# Patient Record
Sex: Male | Born: 1995 | Race: Black or African American | Hispanic: No | Marital: Single | Smoking: Current every day smoker
Health system: Southern US, Community
[De-identification: ages and names within clinical notes are randomized; demographics above are authoritative.]

## PROBLEM LIST (undated history)

## (undated) ENCOUNTER — Emergency Department (HOSPITAL_COMMUNITY): Admission: EM | Discharge status: Absent without leave | Payer: Self-pay

## (undated) DIAGNOSIS — Z789 Other specified health status: Secondary | ICD-10-CM

## (undated) HISTORY — PX: NO PAST SURGERIES: SHX2092

---
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
## 2013-10-29 ENCOUNTER — Emergency Department (HOSPITAL_COMMUNITY)
Admission: EM | Admit: 2013-10-29 | Discharge: 2013-10-29 | Disposition: A | Discharge status: Home / Self Care | Payer: Self-pay | Attending: Emergency Medicine | Admitting: Emergency Medicine

## 2013-10-29 ENCOUNTER — Emergency Department (HOSPITAL_COMMUNITY): Payer: Self-pay

## 2013-10-29 ENCOUNTER — Encounter (HOSPITAL_COMMUNITY): Payer: Self-pay | Admitting: Emergency Medicine

## 2013-10-29 DIAGNOSIS — J36 Peritonsillar abscess: Secondary | ICD-10-CM | POA: Insufficient documentation

## 2013-10-29 LAB — I-STAT CHEM 8, ED
BUN: 10 mg/dL (ref 6–23)
CALCIUM ION: 1.19 mmol/L (ref 1.12–1.23)
Chloride: 100 mEq/L (ref 96–112)
Creatinine, Ser: 0.9 mg/dL (ref 0.50–1.35)
GLUCOSE: 109 mg/dL — AB (ref 70–99)
HEMATOCRIT: 47 % (ref 39.0–52.0)
HEMOGLOBIN: 16 g/dL (ref 13.0–17.0)
Potassium: 3.8 mEq/L (ref 3.7–5.3)
Sodium: 139 mEq/L (ref 137–147)
TCO2: 23 mmol/L (ref 0–100)

## 2013-10-29 LAB — CBC WITH DIFFERENTIAL/PLATELET
BASOS ABS: 0 10*3/uL (ref 0.0–0.1)
BASOS PCT: 0 % (ref 0–1)
Eosinophils Absolute: 0.1 10*3/uL (ref 0.0–0.7)
Eosinophils Relative: 0 % (ref 0–5)
HCT: 41.5 % (ref 39.0–52.0)
Hemoglobin: 14.2 g/dL (ref 13.0–17.0)
Lymphocytes Relative: 7 % — ABNORMAL LOW (ref 12–46)
Lymphs Abs: 1.4 10*3/uL (ref 0.7–4.0)
MCH: 29.6 pg (ref 26.0–34.0)
MCHC: 34.2 g/dL (ref 30.0–36.0)
MCV: 86.5 fL (ref 78.0–100.0)
MONO ABS: 2.5 10*3/uL — AB (ref 0.1–1.0)
Monocytes Relative: 13 % — ABNORMAL HIGH (ref 3–12)
NEUTROS ABS: 15.6 10*3/uL — AB (ref 1.7–7.7)
NEUTROS PCT: 80 % — AB (ref 43–77)
Platelets: 272 10*3/uL (ref 150–400)
RBC: 4.8 MIL/uL (ref 4.22–5.81)
RDW: 12.4 % (ref 11.5–15.5)
WBC: 19.6 10*3/uL — ABNORMAL HIGH (ref 4.0–10.5)

## 2013-10-29 LAB — COMPREHENSIVE METABOLIC PANEL
ALBUMIN: 4.2 g/dL (ref 3.5–5.2)
ALT: 8 U/L (ref 0–53)
AST: 15 U/L (ref 0–37)
Alkaline Phosphatase: 102 U/L (ref 39–117)
BILIRUBIN TOTAL: 1 mg/dL (ref 0.3–1.2)
BUN: 11 mg/dL (ref 6–23)
CHLORIDE: 97 meq/L (ref 96–112)
CO2: 23 mEq/L (ref 19–32)
CREATININE: 0.9 mg/dL (ref 0.50–1.35)
Calcium: 9.8 mg/dL (ref 8.4–10.5)
GFR calc Af Amer: >90 mL/min (ref >90)
GFR calc non Af Amer: >90 mL/min (ref >90)
Glucose, Bld: 93 mg/dL (ref 70–99)
POTASSIUM: 4 meq/L (ref 3.7–5.3)
Sodium: 137 mEq/L (ref 137–147)
Total Protein: 7.8 g/dL (ref 6.0–8.3)

## 2013-10-29 LAB — RAPID STREP SCREEN (MED CTR MEBANE ONLY): STREPTOCOCCUS, GROUP A SCREEN (DIRECT): NEGATIVE

## 2013-10-29 MED ORDER — DEXAMETHASONE SODIUM PHOSPHATE 10 MG/ML IJ SOLN
10.0000 mg | Freq: Once | INTRAMUSCULAR | Status: AC
  Administered 2013-10-29: 10 mg via INTRAVENOUS
  Filled 2013-10-29: qty 1

## 2013-10-29 MED ORDER — SODIUM CHLORIDE 0.9 % IV BOLUS (SEPSIS)
1000.0000 mL | Freq: Once | INTRAVENOUS | Status: AC
  Administered 2013-10-29: 1000 mL via INTRAVENOUS

## 2013-10-29 MED ORDER — IOHEXOL 300 MG/ML  SOLN
100.0000 mL | Freq: Once | INTRAMUSCULAR | Status: AC | PRN
  Administered 2013-10-29: 100 mL via INTRAVENOUS

## 2013-10-29 MED ORDER — CLINDAMYCIN PHOSPHATE 600 MG/50ML IV SOLN
600.0000 mg | Freq: Once | INTRAVENOUS | Status: AC
  Administered 2013-10-29: 600 mg via INTRAVENOUS
  Filled 2013-10-29: qty 50

## 2013-10-29 MED ORDER — CLINDAMYCIN HCL 150 MG PO CAPS: 450.0000 mg | ORAL_CAPSULE | Freq: Three times a day (TID) | ORAL | Status: DC

## 2013-10-29 NOTE — ED Notes (Signed)
Pt c/o sore throat x 1 week, no cough or redness noted to back of throat.

## 2013-10-29 NOTE — Progress Notes (Signed)
[facility] provided pt with a list of primary care resources to help patient establish a pcp.  °

## 2013-10-29 NOTE — ED Provider Notes (Signed)
CSN: [id number]     Arrival date & time 10/29/13  1135 History   None    Chief Complaint  Patient presents with  . Sore Throat   Patient is a 18 y.o. male presenting with pharyngitis. The history is provided by the patient. No language interpreter was used.  Sore Throat Pertinent negatives include no chest pain and no shortness of breath.   This chart was scribed for non-physician practitioner working with Rolan Bucco, MD, by Andrew Au, ED Scribe. This patient was seen in room WTR7/WTR7 and the patient's care was started at 1:41 PM.  Christopher Shepard is a 18 y.o. male who presents to the Emergency Department complaining of a sore throat x 1 week. Pt reports his throat is swollen with associated chills, neck pain when turing to head to left, burning pain with swallowing, and a productive cough consisting of mucous. Pt reports he has pain with talking.  Pt is a smoker and smokes about 1-2 pack daily. Pt denies fever, ear pain, CP SOB cough, nasal congestion nausea and emesis, numbness, tingling.  Pt does not have PCP.  History reviewed. No pertinent past medical history. History reviewed. No pertinent past surgical history. No family history on file. History  Substance Use Topics  . Smoking status: Never Smoker   . Smokeless tobacco: Not on file  . Alcohol Use: No    Review of Systems  Constitutional: Positive for chills. Negative for fever.  HENT: Positive for sore throat, trouble swallowing and voice change. Negative for congestion and ear pain.   Respiratory: Positive for cough. Negative for chest tightness and shortness of breath.   Cardiovascular: Negative for chest pain.  Gastrointestinal: Negative for nausea, vomiting and diarrhea.  Musculoskeletal: Positive for neck pain.   Allergies  Review of patient's allergies indicates no known allergies.  Home Medications   Prior to Admission medications   Medication Sig Start Date End Date Taking? Authorizing Provider  ibuprofen  (ADVIL,MOTRIN) 200 MG tablet Take 200 mg by mouth every 6 (six) hours as needed for moderate pain.   Yes Historical Provider, MD  clindamycin (CLEOCIN) 150 MG capsule Take 3 capsules (450 mg total) by mouth 3 (three) times daily. 10/29/13   Marissa Sciacca, PA-C   BP 138/68  Pulse 56  Temp(Src) 98.9 F (37.2 C) (Oral)  Resp 18  SpO2 99% Physical Exam  Nursing note and vitals reviewed. Constitutional: He is oriented to person, place, and time. He appears well-developed and well-nourished. No distress.  HENT:  Head: Normocephalic and atraumatic.  Mouth/Throat: Oropharynx is clear and moist. No oropharyngeal exudate.  Bilateral tonsillar adenopathy identified with left more so than right. Uvula midline, swelling identified to the uvula. Mild erythema identified to the posterior oropharynx and tonsils bilaterally. Negative exudate. Negative petechiae. Mild trismus.  Eyes: Conjunctivae and EOM are normal. Pupils are equal, round, and reactive to light. Right eye exhibits no discharge. Left eye exhibits no discharge.  Neck: Normal range of motion. Neck supple. No tracheal deviation present.    Moderate left side tonsillar adenopathy identified with mild discomfort upon palpation. Discomfort upon palpation to the left aspect of the neck. Mild swelling identified to left aspect of the neck.  Cardiovascular: Normal rate, regular rhythm and normal heart sounds.  Exam reveals no friction rub.   No murmur heard. Pulses:      Radial pulses are 2+ on the right side, and 2+ on the left side.  Cap refill < 3 seconds  Pulmonary/Chest: Effort normal  and breath sounds normal. No respiratory distress. He has no wheezes. He has no rales.  Patient is able to speak in full sentences without difficulty Negative use of accessory muscles Negative stridor Patient speaks in a muffled voice  Musculoskeletal: Normal range of motion.  Full ROM to upper and lower extremities without difficulty noted, negative ataxia  noted.  Lymphadenopathy:    He has cervical adenopathy.  Neurological: He is alert and oriented to person, place, and time. No cranial nerve deficit. He exhibits normal muscle tone. Coordination normal.  Cranial nerves III-XII grossly intact Negative facial drooping Negative slurred speech Negative aphasia  Skin: Skin is warm and dry. No rash noted. He is not diaphoretic. No erythema.  Psychiatric: He has a normal mood and affect. His behavior is normal. Thought content normal.    ED Course  Procedures  5:58 PM This provider spoke with Dr. Jenne PaneBates, ENT - discussed case, history, labs and imaging in great detail. As per physician recommended IV clindamycin and Decadron to be administered in ED setting. Reported that patient can be discharged the patient will followup in his office tomorrow for the abscess to be drained.  Results for orders placed during the hospital encounter of 10/29/13  RAPID STREP SCREEN      Result Value Ref Range   Streptococcus, Group A Screen (Direct) NEGATIVE  NEGATIVE  CBC WITH DIFFERENTIAL      Result Value Ref Range   WBC 19.6 (*) 4.0 - 10.5 K/uL   RBC 4.80  4.22 - 5.81 MIL/uL   Hemoglobin 14.2  13.0 - 17.0 g/dL   HCT 16.141.5  09.639.0 - 04.552.0 %   MCV 86.5  78.0 - 100.0 fL   MCH 29.6  26.0 - 34.0 pg   MCHC 34.2  30.0 - 36.0 g/dL   RDW 40.912.4  81.111.5 - 91.415.5 %   Platelets 272  150 - 400 K/uL   Neutrophils Relative % 80 (*) 43 - 77 %   Neutro Abs 15.6 (*) 1.7 - 7.7 K/uL   Lymphocytes Relative 7 (*) 12 - 46 %   Lymphs Abs 1.4  0.7 - 4.0 K/uL   Monocytes Relative 13 (*) 3 - 12 %   Monocytes Absolute 2.5 (*) 0.1 - 1.0 K/uL   Eosinophils Relative 0  0 - 5 %   Eosinophils Absolute 0.1  0.0 - 0.7 K/uL   Basophils Relative 0  0 - 1 %   Basophils Absolute 0.0  0.0 - 0.1 K/uL  COMPREHENSIVE METABOLIC PANEL      Result Value Ref Range   Sodium 137  137 - 147 mEq/L   Potassium 4.0  3.7 - 5.3 mEq/L   Chloride 97  96 - 112 mEq/L   CO2 23  19 - 32 mEq/L   Glucose, Bld 93   70 - 99 mg/dL   BUN 11  6 - 23 mg/dL   Creatinine, Ser 7.820.90  0.50 - 1.35 mg/dL   Calcium 9.8  8.4 - 95.610.5 mg/dL   Total Protein 7.8  6.0 - 8.3 g/dL   Albumin 4.2  3.5 - 5.2 g/dL   AST 15  0 - 37 U/L   ALT 8  0 - 53 U/L   Alkaline Phosphatase 102  39 - 117 U/L   Total Bilirubin 1.0  0.3 - 1.2 mg/dL   GFR calc non Af Amer >90  >90 mL/min   GFR calc Af Amer >90  >90 mL/min  I-STAT CHEM 8, ED  Result Value Ref Range   Sodium 139  137 - 147 mEq/L   Potassium 3.8  3.7 - 5.3 mEq/L   Chloride 100  96 - 112 mEq/L   BUN 10  6 - 23 mg/dL   Creatinine, Ser 1.610.90  0.50 - 1.35 mg/dL   Glucose, Bld [id number] (*) 70 - 99 mg/dL   Calcium, Ion 0.451.19  4.091.12 - 1.23 mmol/L   TCO2 23  0 - 100 mmol/L   Hemoglobin 16.0  13.0 - 17.0 g/dL   HCT 81.147.0  91.439.0 - 78.252.0 %    Labs Review Labs Reviewed  CBC WITH DIFFERENTIAL - Abnormal; Notable for the following:    WBC 19.6 (*)    Neutrophils Relative % 80 (*)    Neutro Abs 15.6 (*)    Lymphocytes Relative 7 (*)    Monocytes Relative 13 (*)    Monocytes Absolute 2.5 (*)    All other components within normal limits  I-STAT CHEM 8, ED - Abnormal; Notable for the following:    Glucose, Bld 109 (*)    All other components within normal limits  RAPID STREP SCREEN  CULTURE, GROUP A STREP  COMPREHENSIVE METABOLIC PANEL   Results for orders placed during the hospital encounter of 10/29/13  RAPID STREP SCREEN      Result Value Ref Range   Streptococcus, Group A Screen (Direct) NEGATIVE  NEGATIVE  CBC WITH DIFFERENTIAL      Result Value Ref Range   WBC 19.6 (*) 4.0 - 10.5 K/uL   RBC 4.80  4.22 - 5.81 MIL/uL   Hemoglobin 14.2  13.0 - 17.0 g/dL   HCT 95.641.5  21.339.0 - 08.652.0 %   MCV 86.5  78.0 - 100.0 fL   MCH 29.6  26.0 - 34.0 pg   MCHC 34.2  30.0 - 36.0 g/dL   RDW 57.812.4  46.911.5 - 62.915.5 %   Platelets 272  150 - 400 K/uL   Neutrophils Relative % 80 (*) 43 - 77 %   Neutro Abs 15.6 (*) 1.7 - 7.7 K/uL   Lymphocytes Relative 7 (*) 12 - 46 %   Lymphs Abs 1.4  0.7 - 4.0 K/uL    Monocytes Relative 13 (*) 3 - 12 %   Monocytes Absolute 2.5 (*) 0.1 - 1.0 K/uL   Eosinophils Relative 0  0 - 5 %   Eosinophils Absolute 0.1  0.0 - 0.7 K/uL   Basophils Relative 0  0 - 1 %   Basophils Absolute 0.0  0.0 - 0.1 K/uL  COMPREHENSIVE METABOLIC PANEL      Result Value Ref Range   Sodium 137  137 - 147 mEq/L   Potassium 4.0  3.7 - 5.3 mEq/L   Chloride 97  96 - 112 mEq/L   CO2 23  19 - 32 mEq/L   Glucose, Bld 93  70 - 99 mg/dL   BUN 11  6 - 23 mg/dL   Creatinine, Ser 5.280.90  0.50 - 1.35 mg/dL   Calcium 9.8  8.4 - 41.310.5 mg/dL   Total Protein 7.8  6.0 - 8.3 g/dL   Albumin 4.2  3.5 - 5.2 g/dL   AST 15  0 - 37 U/L   ALT 8  0 - 53 U/L   Alkaline Phosphatase 102  39 - 117 U/L   Total Bilirubin 1.0  0.3 - 1.2 mg/dL   GFR calc non Af Amer >90  >90 mL/min   GFR calc Af Amer >90  >90  mL/min  I-STAT CHEM 8, ED      Result Value Ref Range   Sodium 139  137 - 147 mEq/L   Potassium 3.8  3.7 - 5.3 mEq/L   Chloride 100  96 - 112 mEq/L   BUN 10  6 - 23 mg/dL   Creatinine, Ser 1.61  0.50 - 1.35 mg/dL   Glucose, Bld 096 (*) 70 - 99 mg/dL   Calcium, Ion 0.45  4.09 - 1.23 mmol/L   TCO2 23  0 - 100 mmol/L   Hemoglobin 16.0  13.0 - 17.0 g/dL   HCT 81.1  91.4 - 78.2 %   Imaging Review Ct Soft Tissue Neck W Contrast  10/29/2013   CLINICAL DATA:  Uvula swelling and deviation.  EXAM: CT NECK WITH CONTRAST  TECHNIQUE: Multidetector CT imaging of the neck was performed using the standard protocol following the bolus administration of intravenous contrast.  CONTRAST:  OMNIPAQUE IOHEXOL 300 MG/ML  SOLN  COMPARISON:  None.  FINDINGS: The visualized portions of the brain and posterior fossa demonstrate a normal appearance.  The visualized paranasal sinuses are clear.  No mastoid effusion.  The salivary glands including the parotid glands and submandibular glands are within normal limits.  Oral cavity is normal without mass lesion or loculated fluid collection.  A 1.0 x 1.5 x 1.2 cm hypodense rim  enhancing collection seen inferior and slightly posterior to the left palatine tonsil (series 2, image 46), compatible with peritonsillar abscess. There is mild anterior and medial displacement of the left palatine tonsil. Inflammatory soft tissue stranding seen within the left parapharyngeal fat, extending cephalad towards the left nasopharynx. Is secondary mild deviation of the uvula. There is mild mass effect on the or pharyngeal airway which remains patent.  Inferiorly, there is separate area edema/phlegmon within the retropharyngeal/ prevertebral soft tissues without frank retropharyngeal abscess at the level of C4 (series 2, image 55).  Epiglottis and vallecular grossly normal. True vocal cords within normal limits. Subglottic airway is clear.  Thyroid gland is unremarkable.  Enlarged left level II IA lymph nodes measuring up to 1.9 cm in short axis are present, likely reactive. There is an enlarged right level IIa node measuring 1.2 cm in short axis.  Visualized superior mediastinum within normal limits.  Visualized lung apices are clear.  Normal intravascular enhancement seen throughout the neck.  No acute osseous abnormality. No worrisome lytic or blastic osseous lesions.  IMPRESSION: 1. 1.0 x 1.5 x 1.2 cm left peritonsillar abscess as above. 2. Separate area of sub mucosal edema/phlegmon within the retropharyngeal prevertebral soft tissues more inferiorly within the neck at the level of C4 without frank retropharyngeal abscess. The airway remains widely patent. 3. Enlarged bilateral level II cervical adenopathy, left greater than right, likely reactive in nature.   Electronically Signed   By: Rise Mu M.D.   On: 10/29/2013 16:45     EKG Interpretation None      MDM   Final diagnoses:  Peritonsillar abscess   Filed Vitals:   10/29/13 1201 10/29/13 1916  BP: 138/68 131/64  Pulse: 56 53  Temp: 98.9 F (37.2 C) 98.6 F (37 C)  TempSrc: Oral Oral  Resp: 18 15  SpO2: 99% 100%    I personally performed the services described in this documentation, which was scribed in my presence. The recorded information has been reviewed and is accurate.  Rapid strep negative. CBC noted elevated white blood cell count of 19.6 with an elevated neutrophil at 15.6. CMP  negative findings-kidney and liver function well. Negative elevation of liver enzymes. CT soft tissue of neck identified a 1.0 x 1.5 x 1.2 cm left peritonsillar abscess-separate area of submucosal edema/phlegmon within the retropharyngeal prevertebral soft tissues more inferiorly within the neck the level of C4 without frank retropharyngeal abscess area remains widely patent. A large cervical level II cervical adenopathy, left greater than right likely reactive in nature. This provider spoke with Dr. Jenne Pane, ENT physician-discussed in great detail labs and imaging. As per physician recommended IV antibiotics and for patient be discharged and followup in the office tomorrow. IV clindamycin and Decadron administered in ED setting. Patient stable, afebrile. Patient is not septic appearing. Discharged patient with antibiotics. Discussed with patient that he needs to call Dr. Jenne Pane office tomorrow morning for procedure to be performed-patient understood and agreed. Discussed with patient to closely monitor symptoms and if symptoms are to worsen or change to report back to the ED - strict return instructions given.  Patient agreed to plan of care, understood, all questions answered.   Raymon Mutton, PA-C 10/30/13 1108

## 2013-10-29 NOTE — Discharge Instructions (Signed)
Please call Dr. Jenne Pane office first thing tomorrow morning he is expecting to see you in the office tomorrow so that he can drain the abscess Please get antibiotics filled Please continue to monitor symptoms closely and if symptoms are to worsen or change (fever greater than 101, chills, sweating, nausea, vomiting, chest pain, shortness of breath, difficulty breathing, throat closing sensation) please report back to the ED immediately  Peritonsillar Abscess A peritonsillar abscess is a collection of pus located in the back of the throat behind the tonsils. It usually occurs when a streptococcal infection of the throat or tonsils spreads into the space around the tonsils. They are almost always caused by the streptococcal germ (bacteria). The treatment of a peritonsillar abscess is most often drainage accomplished by putting a needle into the abscess or cutting (incising) and draining the abscess. This is most often followed with a course of antibiotics. HOME CARE INSTRUCTIONS  If your abscess was drained by your caregiver today, rinse your throat (gargle) with warm salt water four times per day or as needed for comfort. Do not swallow this mixture. Mix 1 teaspoon of salt in 8 ounces of warm water for gargling.  Rest in bed as needed. Resume activities as able.  Apply cold to your neck for pain relief. Fill a plastic bag with ice and wrap it in a towel. Hold the ice on your neck for 20 minutes 4 times per day.  Eat a soft or liquid diet as tolerated while your throat remains sore. Popsicles and ice cream may be good early choices. Drinking plenty of cold fluids will probably be soothing and help take swelling down in between the warm gargles.  Only take over-the-counter or prescription medicines for pain, discomfort, or fever as directed by your caregiver. Do not use aspirin unless directed by your physician. Aspirin slows down the clotting process. It can also cause bleeding from the drainage area if  this was needled or incised today.  If antibiotics were prescribed, take them as directed for the full course of the prescription. Even if you feel you are well, you need to take them. SEEK MEDICAL CARE IF:   You have increased pain, swelling, redness, or drainage in your throat.  You develop signs of infection such as dizziness, headache, lethargy, or generalized feelings of illness.  You have difficulty breathing, swallowing or eating.  You show signs of becoming dehydrated (lightheadedness when standing, decreased urine output, a fast heart rate, or dry mouth and mucous membranes). SEEK IMMEDIATE MEDICAL CARE IF:   You have a fever.  You are coughing up or vomiting blood.  You develop more severe throat pain uncontrolled with medicines or you start to drool.  You develop difficulty breathing, talking, or find it easier to breathe while leaning forward. Document Released: 04/26/2005 Document Revised: 07/19/2011 Document Reviewed: 12/08/2007 [facility] Patient Information 2015 [place], [place]. This information is not intended to replace advice given to you by your health care provider. Make sure you discuss any questions you have with your health care provider.   Emergency Department Resource Guide 1) Find a Doctor and Pay Out of Pocket Although you won't have to find out who is covered by your insurance plan, it is a good idea to ask around and get recommendations. You will then need to call the office and see if the doctor you have chosen will accept you as a new patient and what types of options they offer for patients who are self-pay. Some doctors offer  discounts or will set up payment plans for their patients who do not have insurance, but you will need to ask so you aren't surprised when you get to your appointment.  2) Contact Your Local Health Department Not all health departments have doctors that can see patients for sick visits, but many do, so it is worth a call to see if  yours does. If you don't know where your local health department is, you can check in your phone book. The CDC also has a tool to help you locate your state's health department, and many state websites also have listings of all of their local health departments.  3) Find a Walk-in Clinic If your illness is not likely to be very severe or complicated, you may want to try a walk in clinic. These are popping up all over the country in pharmacies, drugstores, and shopping centers. They're usually staffed by nurse practitioners or physician assistants that have been trained to treat common illnesses and complaints. They're usually fairly quick and inexpensive. However, if you have serious medical issues or chronic medical problems, these are probably not your best option.  No Primary Care Doctor: - Call Health Connect at  [phone] - they can help you locate a primary care doctor that  accepts your insurance, provides certain services, etc. - Physician Referral Service- [phone]  Chronic Pain Problems: Organization         Address  Phone   Notes  [facility]  [phone] Patients need to be referred by their primary care doctor.   Medication Assistance: Organization         Address  Phone   Notes  [facility] Medication [facility] [address] [place], [place] [address] [phone] --Must be a resident of [facility] [place] -- Must have NO insurance coverage whatsoever (no Medicaid/ Medicare, etc.) -- The pt. MUST have a primary care doctor that directs their care regularly and follows them in the community   Med[facility]ist  [phone]) [phone]   [employer]  [phone]    Agencies that provide inexpensive medical care: Organization         Address  Phone   Notes  [facility]  [phone]   [facility]    [phone]   [facility] [address] [place], [place] [address] [phone]) [phone]    [facility] [address], [place] [phone]   [facility]anned Parenthood    [phone]) [phone]   [facility]    [phone]) [phone]   [facility] and [facility]  [address], [place] Phone:  [phone], Fax:  [phone]) [phone] Hours of Operation:  9 am - 6 pm, M-F.  Also accepts Medicaid/Medicare and self-pay.  [facility]  [address], [place] Phone: [phone](336) [phone], Fax: [phone]. Hours of Operation:  8:30 am - 5:30 pm, M-F.  Also accepts Medicaid and self-pay.  [facility] [place] [address], [place] Phone: [phone]) [phone]   [facility] [address], [place], [place] [phone], Ext. [phone] Mondays & Thursdays: 7-9 AM.  First 15 patients are seen on a first come, first serve basis.    Medicaid-accepting [facility] Providers:  Organization         Address  Phone   Notes  [facility] [address],  [place] [phone]) [phone] Also accepts self-pay patients.  [facility] [address], [place]  [phone]) [phone]   [facility] [address], [place] [phone]) [phone]   [facility] [address] [place] [address], [place] [phone]) [phone]   Renaye RakersVeita Bland [address], [place]   [phone]) [phone] Only accepts [place] Access [place] patients after they have their name applied to their card.   Self-Pay (no insurance) in [facility] [place]:  Organization         Address  Phone   Notes  Sickle Cell Patients, [facility] [address], [place] [phone]) [phone]   [facility] [address], [place] [phone]   [facility]  [address], [place] [phone]) [phone]   [facility]/Dr. Osei-Bonsu  [address] [place] [address], [place] or [phone], [place] [phone])  [phone] Phone number for both [place] and [place] locations is the same.  [facility] [address], [place] [phone](336) [phone]   [facility] [place] [address] [place] [address], [place] or [address] [phone]) [phone]) [phone]   [facility] [address], [place] [phone]) [phone], phone; [phone](336) [phone], fax Sees patients 1st and 3rd Saturday of every month.  Must not qualify for public or private insurance (i.e. Medicaid, Medicare, [place] Health Choice, Veterans' Benefits)  Household income should be no more than 200% of the poverty level The clinic cannot treat you if you are pregnant or think you are pregnant  Sexually transmitted diseases are not treated at the clinic.    Dental Care: Organization         Address  Phone  Notes  [facility] of [facility] [address], [place] [phone] Accepts children up to age 20 who are enrolled in [place] or [place] Health Choice; pregnant women with a Medicaid card; and children who have applied for Medicaid or [place] Health Choice, but were declined, whose parents can pay a reduced fee at time of service.  [facility] of [facility] [place]  [address], [place] [phone]) [phone] Accepts children up to age 521 who are enrolled in [place] or [place] Health Choice; pregnant women with a Medicaid card; and children who have applied for Medicaid or [place] Health Choice, but were declined, whose parents can pay a reduced fee at time of service.  [facility]  [address], [place] [phone]) [phone] Patients are seen by appointment only. Walk-ins are not accepted. [facility] will see patients 20 years of age and older. Monday - Tuesday (8am-5pm) Most Wednesdays (8:30-5pm) $30 per visit, cash only  [facility]  [address], [place] [facility] [phone] Patients are seen by  appointment only. Walk-ins are not accepted. [facility] will see patients 20 years of age and older. One Wednesday Evening (Monthly: Volunteer Based).  $30 per visit, cash only  [employer] [facility]  [phone] for adults; Children under age 654, call Graduate Pediatric Dentistry at [phone]. Children aged 544-14, please call [phone] to request a pediatric application.  Dental services are provided in all areas of dental care including fillings, crowns and bridges, complete and partial dentures, implants, gum treatment, root canals, and extractions. Preventive care is  also provided. Treatment is provided to both adults and children. Patients are selected via a lottery and there is often a waiting list.   [facility] [address], [place]  [phone] [email or web address]   [facility] [address] [place], [place] [phone], Ext. [phone] Second and Fourth Thursday of each month, opens at 6:30 AM; Clinic ends at 9 AM.  Patients are seen on a first-come first-served basis, and a limited number are seen during each clinic.   [facility]  [address] [place], [place] [phone]   Eligibility Requirements You must have lived in [place], [place], or [place] counties for at least the last three months.   You cannot be eligible for state or federal sponsored [place], including [employer], [place], or [employer].   You generally cannot be eligible for healthcare insurance through your employer.    How to apply: Eligibility screenings are held every Tuesday and Wednesday afternoon from 1:00 pm until 4:00 pm. You do not need an appointment for the interview!  [facility] [address] [place], [place] [phone]   [facility] Health Department  [phone]   [facility] Health Department  [phone]   [facility] Health Department  [phone]     Behavioral Health Resources in the Community: Intensive Outpatient Programs Organization         Address  Phone  Notes  [facility] Services [address] [place], [place] [phone]   [facility] Outpatient [address], [place], [place] [phone]   [facility] [address], [place], [place]  [phone]   [facility] [address]  [place], [place] [phone] or [phone]   Substance Abuse Resources Organization         Address  Phone  Notes  Alcohol [facility] Drug Services  [phone]   [facility]  [phone]   [facility] [place]  [phone]   Floydene Flock  [phone]   Residential & Outpatient Substance Abuse Program  [phone]   Psychological Services Organization         Address  Phone  Notes  [facility]  [phone]   [facility]  [phone]   [facility] [address] Oriah Leinweber [phone] or [phone]    Mobile Crisis Teams Organization         Address  Phone  Notes  [facility], Mobile Crisis Care Unit  [phone]   [facility]  [address] [place], [place] [phone]   Doristine Locks [address] [place] [phone]    Self-Help/Support Groups Organization         Address  Phone             Notes  [facility] [place] - variety of support groups  [phone]- I7437963 Call for more information  Narcotics Anonymous (NA), [facility] [address], [employer] [place]  2 meetings at this location   Statistician         Address  Phone  Notes  [facility] [address],    [place]  [phone]   [facility]  [address], [place] [address], [place], [place] [phone]   [facility] [address] [place], [place] [phone] Admissions: 8am-3pm M-F  Incentives  Substance Abuse Treatment Center [address]  [place], [place]   [facility] [address] Jadene Pierini [place], [place]   The [place].,  [place], [place]   [facility] - Intensive Outpatient [address], [place], [place]   [facility] [address]  [place], [place] [phone] or [phone]   [facility] (RTS) [address] [place], [place] Accepts Medicaid  [facility] [place] [address],  [place] [phone] Substance Abuse/Addiction Treatment   [facility] Organization         Address  Phone  Notes  [facility]  [phone]   Domenic Schwab, PhD [address] Arlis Porta [place], [place]   [phone] or [phone]   [facility] [place], [place] [phone]   [facility] [address], [place], [place] [phone] Insurance/Medicaid/sponsorship through [facility] and Families [address]                                    [place], [place] [phone] [place] [address], [place] [phone]    Dr. Adele Schilder  [phone]   [facility] Dept. 1) [address], [place] 2) [place] 3)  [place] [address], [place] [phone]  [phone]   [place] [phone] or [phone] (After Hours)

## 2013-10-30 NOTE — ED Provider Notes (Signed)
Medical screening examination/treatment/procedure(s) were performed by non-physician practitioner and as supervising physician I was immediately available for consultation/collaboration.   EKG Interpretation None        Rolan BuccoMelanie Kamarius Buckbee, MD 10/30/13 1433

## 2013-10-31 LAB — CULTURE, GROUP A STREP

## 2013-11-11 ENCOUNTER — Emergency Department (HOSPITAL_COMMUNITY)
Admission: EM | Admit: 2013-11-11 | Discharge: 2013-11-12 | Disposition: A | Discharge status: Home / Self Care | Payer: Self-pay | Attending: Emergency Medicine | Admitting: Emergency Medicine

## 2013-11-11 ENCOUNTER — Encounter (HOSPITAL_COMMUNITY): Payer: Self-pay | Admitting: Emergency Medicine

## 2013-11-11 ENCOUNTER — Emergency Department (HOSPITAL_COMMUNITY): Payer: Self-pay

## 2013-11-11 DIAGNOSIS — S0081XA Abrasion of other part of head, initial encounter: Secondary | ICD-10-CM

## 2013-11-11 DIAGNOSIS — S0990XA Unspecified injury of head, initial encounter: Secondary | ICD-10-CM | POA: Insufficient documentation

## 2013-11-11 DIAGNOSIS — Z792 Long term (current) use of antibiotics: Secondary | ICD-10-CM | POA: Insufficient documentation

## 2013-11-11 DIAGNOSIS — S0180XA Unspecified open wound of other part of head, initial encounter: Secondary | ICD-10-CM | POA: Insufficient documentation

## 2013-11-11 DIAGNOSIS — T07XXXA Unspecified multiple injuries, initial encounter: Secondary | ICD-10-CM

## 2013-11-11 DIAGNOSIS — IMO0002 Reserved for concepts with insufficient information to code with codable children: Secondary | ICD-10-CM | POA: Insufficient documentation

## 2013-11-11 DIAGNOSIS — S0181XA Laceration without foreign body of other part of head, initial encounter: Secondary | ICD-10-CM

## 2013-11-11 DIAGNOSIS — S025XXA Fracture of tooth (traumatic), initial encounter for closed fracture: Secondary | ICD-10-CM | POA: Insufficient documentation

## 2013-11-11 MED ORDER — OXYCODONE-ACETAMINOPHEN 5-325 MG PO TABS
1.0000 | ORAL_TABLET | Freq: Once | ORAL | Status: AC
  Administered 2013-11-11: 1 via ORAL
  Filled 2013-11-11: qty 1

## 2013-11-11 MED ORDER — TETANUS-DIPHTH-ACELL PERTUSSIS 5-2.5-18.5 LF-MCG/0.5 IM SUSP: 0.5000 mL | Freq: Once | INTRAMUSCULAR | Status: DC
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
## 2013-11-12 MED ORDER — IBUPROFEN 800 MG PO TABS
800.0000 mg | ORAL_TABLET | Freq: Once | ORAL | Status: AC
  Administered 2013-11-12: 800 mg via ORAL
  Filled 2013-11-12: qty 1

## 2013-11-12 MED ORDER — HYDROCODONE-ACETAMINOPHEN 5-325 MG PO TABS: 1.0000 | ORAL_TABLET | ORAL | Status: DC | PRN

## 2013-11-12 MED ORDER — IBUPROFEN 600 MG PO TABS: 600.0000 mg | ORAL_TABLET | Freq: Four times a day (QID) | ORAL | Status: DC | PRN

## 2013-11-12 NOTE — Discharge Instructions (Signed)
Ibuprofen for pain. Norco for severe pain. Apply bacitracin twice a day to all the abrasions. Follow up in 7 days for suture removal. Follow up with a dentist regarding a dental fracture as referred.   Facial or Scalp Contusion A facial or scalp contusion is a deep bruise on the face or head. Injuries to the face and head generally cause a lot of swelling, especially around the eyes. Contusions are the result of an injury that caused bleeding under the skin. The contusion may turn blue, purple, or yellow. Minor injuries will give you a painless contusion, but more severe contusions may stay painful and swollen for a few weeks.  CAUSES  A facial or scalp contusion is caused by a blunt injury or trauma to the face or head area.  SIGNS AND SYMPTOMS   Swelling of the injured area.   Discoloration of the injured area.   Tenderness, soreness, or pain in the injured area.  DIAGNOSIS  The diagnosis can be made by taking a medical history and doing a physical exam. An X-ray exam, CT scan, or MRI may be needed to determine if there are any associated injuries, such as broken bones (fractures). TREATMENT  Often, the best treatment for a facial or scalp contusion is applying cold compresses to the injured area. Over-the-counter medicines may also be recommended for pain control.  HOME CARE INSTRUCTIONS   Only take over-the-counter or prescription medicines as directed by your health care provider.   Apply ice to the injured area.   Put ice in a plastic bag.   Place a towel between your skin and the bag.   Leave the ice on for 20 minutes, 2-3 times a day.  SEEK MEDICAL CARE IF:  You have bite problems.   You have pain with chewing.   You are concerned about facial defects. SEEK IMMEDIATE MEDICAL CARE IF:  You have severe pain or a headache that is not relieved by medicine.   You have unusual sleepiness, confusion, or personality changes.   You throw up (vomit).   You have a  persistent nosebleed.   You have double vision or blurred vision.   You have fluid drainage from your nose or ear.   You have difficulty walking or using your arms or legs.  MAKE SURE YOU:   Understand these instructions.  Will watch your condition.  Will get help right away if you are not doing well or get worse. Document Released: 06/03/2004 Document Revised: 02/14/2013 Document Reviewed: 12/07/2012 [facility] Patient Information 2015 [place], [place]. This information is not intended to replace advice given to you by your health care provider. Make sure you discuss any questions you have with your health care provider.  Facial Laceration  A facial laceration is a cut on the face. These injuries can be painful and cause bleeding. Lacerations usually heal quickly, but they need special care to reduce scarring. DIAGNOSIS  Your health care provider will take a medical history, ask for details about how the injury occurred, and examine the wound to determine how deep the cut is. TREATMENT  Some facial lacerations may not require closure. Others may not be able to be closed because of an increased risk of infection. The risk of infection and the chance for successful closure will depend on various factors, including the amount of time since the injury occurred. The wound may be cleaned to help prevent infection. If closure is appropriate, pain medicines may be given if needed. Your health care provider  will use stitches (sutures), wound glue (adhesive), or skin adhesive strips to repair the laceration. These tools bring the skin edges together to allow for faster healing and a better cosmetic outcome. If needed, you may also be given a tetanus shot. HOME CARE INSTRUCTIONS  Only take over-the-counter or prescription medicines as directed by your health care provider.  Follow your health care provider's instructions for wound care. These instructions will vary depending on the technique used  for closing the wound. For Sutures:  Keep the wound clean and dry.   If you were given a bandage (dressing), you should change it at least once a day. Also change the dressing if it becomes wet or dirty, or as directed by your health care provider.   Wash the wound with soap and water 2 times a day. Rinse the wound off with water to remove all soap. Pat the wound dry with a clean towel.   After cleaning, apply a thin layer of the antibiotic ointment recommended by your health care provider. This will help prevent infection and keep the dressing from sticking.   You may shower as usual after the first 24 hours. Do not soak the wound in water until the sutures are removed.   Get your sutures removed as directed by your health care provider. With facial lacerations, sutures should usually be taken out after 4-5 days to avoid stitch marks.   Wait a few days after your sutures are removed before applying any makeup. For Skin Adhesive Strips:  Keep the wound clean and dry.   Do not get the skin adhesive strips wet. You may bathe carefully, using caution to keep the wound dry.   If the wound gets wet, pat it dry with a clean towel.   Skin adhesive strips will fall off on their own. You may trim the strips as the wound heals. Do not remove skin adhesive strips that are still stuck to the wound. They will fall off in time.  For Wound Adhesive:  You may briefly wet your wound in the shower or bath. Do not soak or scrub the wound. Do not swim. Avoid periods of heavy sweating until the skin adhesive has fallen off on its own. After showering or bathing, gently pat the wound dry with a clean towel.   Do not apply liquid medicine, cream medicine, ointment medicine, or makeup to your wound while the skin adhesive is in place. This may loosen the film before your wound is healed.   If a dressing is placed over the wound, be careful not to apply tape directly over the skin adhesive. This  may cause the adhesive to be pulled off before the wound is healed.   Avoid prolonged exposure to sunlight or tanning lamps while the skin adhesive is in place.  The skin adhesive will usually remain in place for 5-10 days, then naturally fall off the skin. Do not pick at the adhesive film.  After Healing: Once the wound has healed, cover the wound with sunscreen during the day for 1 full year. This can help minimize scarring. Exposure to ultraviolet light in the first year will darken the scar. It can take 1-2 years for the scar to lose its redness and to heal completely.  SEEK IMMEDIATE MEDICAL CARE IF:  You have redness, pain, or swelling around the wound.   You see ayellowish-white fluid (pus) coming from the wound.   You have chills or a fever.  MAKE SURE YOU:  Understand  these instructions.  Will watch your condition.  Will get help right away if you are not doing well or get worse. Document Released: 06/03/2004 Document Revised: 02/14/2013 Document Reviewed: 12/07/2012 [facility] Patient Information 2015 [place], [place]. This information is not intended to replace advice given to you by your health care provider. Make sure you discuss any questions you have with your health care provider. Head Injury You have received a head injury. It does not appear serious at this time. Headaches and vomiting are common following head injury. It should be easy to awaken from sleeping. Sometimes it is necessary for you to stay in the emergency department for a while for observation. Sometimes admission to the hospital may be needed. After injuries such as yours, most problems occur within the first 24 hours, but side effects may occur up to 7-10 days after the injury. It is important for you to carefully monitor your condition and contact your health care provider or seek immediate medical care if there is a change in your condition. WHAT ARE THE TYPES OF HEAD INJURIES? Head injuries can be as  minor as a bump. Some head injuries can be more severe. More severe head injuries include:  A jarring injury to the brain (concussion).  A bruise of the brain (contusion). This mean there is bleeding in the brain that can cause swelling.  A cracked skull (skull fracture).  Bleeding in the brain that collects, clots, and forms a bump (hematoma). WHAT CAUSES A HEAD INJURY? A serious head injury is most likely to happen to someone who is in a car wreck and is not wearing a seat belt. Other causes of major head injuries include bicycle or motorcycle accidents, sports injuries, and falls. HOW ARE HEAD INJURIES DIAGNOSED? A complete history of the event leading to the injury and your current symptoms will be helpful in diagnosing head injuries. Many times, pictures of the brain, such as CT or MRI are needed to see the extent of the injury. Often, an overnight hospital stay is necessary for observation.  WHEN SHOULD I SEEK IMMEDIATE MEDICAL CARE?  You should get help right away if:  You have confusion or drowsiness.  You feel sick to your stomach (nauseous) or have continued, forceful vomiting.  You have dizziness or unsteadiness that is getting worse.  You have severe, continued headaches not relieved by medicine. Only take over-the-counter or prescription medicines for pain, fever, or discomfort as directed by your health care provider.  You do not have normal function of the arms or legs or are unable to walk.  You notice changes in the black spots in the center of the colored part of your eye (pupil).  You have a clear or bloody fluid coming from your nose or ears.  You have a loss of vision. During the next 24 hours after the injury, you must stay with someone who can watch you for the warning signs. This person should contact local emergency services (911 in the [place] if you have seizures, you become unconscious, or you are unable to wake up. HOW CAN I PREVENT A HEAD INJURY IN THE  FUTURE? The most important factor for preventing major head injuries is avoiding motor vehicle accidents. To minimize the potential for damage to your head, it is crucial to wear seat belts while riding in motor vehicles. Wearing helmets while bike riding and playing collision sports (like football) is also helpful. Also, avoiding dangerous activities around the house will further help reduce your risk  of head injury.  WHEN CAN I RETURN TO NORMAL ACTIVITIES AND ATHLETICS? You should be reevaluated by your health care provider before returning to these activities. If you have any of the following symptoms, you should not return to activities or contact sports until 1 week after the symptoms have stopped:  Persistent headache.  Dizziness or vertigo.  Poor attention and concentration.  Confusion.  Memory problems.  Nausea or vomiting.  Fatigue or tire easily.  Irritability.  Intolerant of bright lights or loud noises.  Anxiety or depression.  Disturbed sleep. MAKE SURE YOU:   Understand these instructions.  Will watch your condition.  Will get help right away if you are not doing well or get worse. Document Released: 04/26/2005 Document Revised: 05/01/2013 Document Reviewed: 01/01/2013 [facility] Patient Information 2015 [place], [place]. This information is not intended to replace advice given to you by your health care provider. Make sure you discuss any questions you have with your health care provider.

## 2013-11-12 NOTE — ED Provider Notes (Signed)
Medical screening examination/treatment/procedure(s) were performed by non-physician practitioner and as supervising physician I was immediately available for consultation/collaboration.   EKG Interpretation None        Isabellamarie Randa S Dairon Procter, MD 11/12/13 2056 

## 2020-02-20 ENCOUNTER — Emergency Department (HOSPITAL_COMMUNITY): Payer: Self-pay | Admitting: Anesthesiology

## 2020-02-20 ENCOUNTER — Encounter (HOSPITAL_COMMUNITY): Payer: Self-pay | Admitting: Emergency Medicine

## 2020-02-20 ENCOUNTER — Encounter (HOSPITAL_COMMUNITY)
Admission: EM | Disposition: A | Discharge status: Home / Self Care | Payer: Self-pay | Source: Home / Self Care | Attending: Emergency Medicine

## 2020-02-20 ENCOUNTER — Emergency Department (HOSPITAL_COMMUNITY): Payer: Self-pay

## 2020-02-20 ENCOUNTER — Ambulatory Visit (HOSPITAL_COMMUNITY)
Admission: EM | Admit: 2020-02-20 | Discharge: 2020-02-20 | Disposition: A | Discharge status: Home / Self Care | Payer: Self-pay | Attending: General Surgery | Admitting: General Surgery

## 2020-02-20 DIAGNOSIS — Z79899 Other long term (current) drug therapy: Secondary | ICD-10-CM | POA: Insufficient documentation

## 2020-02-20 DIAGNOSIS — Y9389 Activity, other specified: Secondary | ICD-10-CM | POA: Insufficient documentation

## 2020-02-20 DIAGNOSIS — X58XXXA Exposure to other specified factors, initial encounter: Secondary | ICD-10-CM | POA: Insufficient documentation

## 2020-02-20 DIAGNOSIS — Z791 Long term (current) use of non-steroidal anti-inflammatories (NSAID): Secondary | ICD-10-CM | POA: Insufficient documentation

## 2020-02-20 DIAGNOSIS — M659 Synovitis and tenosynovitis, unspecified: Secondary | ICD-10-CM

## 2020-02-20 DIAGNOSIS — M65142 Other infective (teno)synovitis, left hand: Secondary | ICD-10-CM | POA: Insufficient documentation

## 2020-02-20 DIAGNOSIS — Z20822 Contact with and (suspected) exposure to covid-19: Secondary | ICD-10-CM | POA: Insufficient documentation

## 2020-02-20 HISTORY — PX: I & D EXTREMITY: SHX5045

## 2020-02-20 HISTORY — DX: Other specified health status: Z78.9

## 2020-02-20 LAB — RESPIRATORY PANEL BY RT PCR (FLU A&B, COVID)
Influenza A by PCR: NEGATIVE
Influenza B by PCR: NEGATIVE
SARS Coronavirus 2 by RT PCR: NEGATIVE

## 2020-02-20 SURGERY — IRRIGATION AND DEBRIDEMENT EXTREMITY
Anesthesia: Monitor Anesthesia Care | Site: Finger | Laterality: Left

## 2020-02-20 MED ORDER — LACTATED RINGERS IV SOLN: INTRAVENOUS | Status: DC | PRN

## 2020-02-20 MED ORDER — PROPOFOL 10 MG/ML IV BOLUS
INTRAVENOUS | Status: DC | PRN
  Administered 2020-02-20: 50 mg via INTRAVENOUS

## 2020-02-20 MED ORDER — GLYCOPYRROLATE 0.2 MG/ML IJ SOLN: 0.2000 mg | Freq: Once | INTRAMUSCULAR | Status: AC

## 2020-02-20 MED ORDER — CEPHALEXIN 500 MG PO CAPS: 500.0000 mg | ORAL_CAPSULE | Freq: Four times a day (QID) | ORAL | 0 refills | Status: AC

## 2020-02-20 MED ORDER — CEFAZOLIN SODIUM-DEXTROSE 2-3 GM-%(50ML) IV SOLR
INTRAVENOUS | Status: DC | PRN
  Administered 2020-02-20: 2 g via INTRAVENOUS

## 2020-02-20 MED ORDER — POVIDONE-IODINE 10 % EX SWAB: 2.0000 "application " | Freq: Once | CUTANEOUS | Status: DC

## 2020-02-20 MED ORDER — BUPIVACAINE HCL (PF) 0.25 % IJ SOLN
INTRAMUSCULAR | Status: AC
  Filled 2020-02-20: qty 10

## 2020-02-20 MED ORDER — SODIUM CHLORIDE 0.9 % IR SOLN
Status: DC | PRN
  Administered 2020-02-20: 1000 mL

## 2020-02-20 MED ORDER — TRAMADOL HCL 50 MG PO TABS
50.0000 mg | ORAL_TABLET | Freq: Four times a day (QID) | ORAL | 0 refills | Status: AC | PRN
Start: 2020-02-20 — End: 2020-02-26

## 2020-02-20 MED ORDER — PROPOFOL 500 MG/50ML IV EMUL
INTRAVENOUS | Status: DC | PRN
  Administered 2020-02-20: 50 ug/kg/min via INTRAVENOUS

## 2020-02-20 MED ORDER — BUPIVACAINE HCL (PF) 0.25 % IJ SOLN
INTRAMUSCULAR | Status: DC | PRN
  Administered 2020-02-20: 10 mL

## 2020-02-20 MED ORDER — FENTANYL CITRATE (PF) 250 MCG/5ML IJ SOLN
INTRAMUSCULAR | Status: DC | PRN
  Administered 2020-02-20 (×2): 50 ug via INTRAVENOUS

## 2020-02-20 MED ORDER — FENTANYL CITRATE (PF) 250 MCG/5ML IJ SOLN
INTRAMUSCULAR | Status: AC
  Filled 2020-02-20: qty 5

## 2020-02-20 MED ORDER — CHLORHEXIDINE GLUCONATE 4 % EX LIQD
60.0000 mL | Freq: Once | CUTANEOUS | Status: DC
  Filled 2020-02-20: qty 60

## 2020-02-20 MED ORDER — LACTATED RINGERS IV SOLN: INTRAVENOUS | Status: DC

## 2020-02-20 MED ORDER — MIDAZOLAM HCL 2 MG/2ML IJ SOLN
INTRAMUSCULAR | Status: DC | PRN
  Administered 2020-02-20: 2 mg via INTRAVENOUS

## 2020-02-20 MED ORDER — CHLORHEXIDINE GLUCONATE 0.12 % MT SOLN
OROMUCOSAL | Status: AC
  Filled 2020-02-20: qty 15

## 2020-02-20 MED ORDER — PROPOFOL 10 MG/ML IV BOLUS
INTRAVENOUS | Status: AC
  Filled 2020-02-20: qty 20

## 2020-02-20 MED ORDER — PROPOFOL 10 MG/ML IV BOLUS
INTRAVENOUS | Status: AC
  Filled 2020-02-20: qty 40

## 2020-02-20 MED ORDER — GLYCOPYRROLATE 0.2 MG/ML IJ SOLN
INTRAMUSCULAR | Status: AC
  Administered 2020-02-20: 0.2 mg via INTRAVENOUS
  Filled 2020-02-20: qty 1

## 2020-02-20 MED ORDER — CHLORHEXIDINE GLUCONATE 0.12 % MT SOLN
15.0000 mL | OROMUCOSAL | Status: DC
  Filled 2020-02-20: qty 15

## 2020-02-20 MED ORDER — MIDAZOLAM HCL 2 MG/2ML IJ SOLN
INTRAMUSCULAR | Status: AC
  Filled 2020-02-20: qty 2

## 2020-02-20 SURGICAL SUPPLY — 35 items
BNDG ELASTIC 3X5.8 VLCR STR LF (GAUZE/BANDAGES/DRESSINGS) ×3 IMPLANT
BNDG ESMARK 4X9 LF (GAUZE/BANDAGES/DRESSINGS) ×3 IMPLANT
CNTNR URN SCR LID CUP LEK RST (MISCELLANEOUS) ×2 IMPLANT
CONT SPEC 4OZ STRL OR WHT (MISCELLANEOUS) ×4
CORD BIPOLAR FORCEPS 12FT (ELECTRODE) ×3 IMPLANT
COVER SURGICAL LIGHT HANDLE (MISCELLANEOUS) ×3 IMPLANT
DRAPE SURG 17X23 STRL (DRAPES) ×3 IMPLANT
DRSG XEROFORM 1X8 (GAUZE/BANDAGES/DRESSINGS) ×3 IMPLANT
ELECT REM PT RETURN 9FT ADLT (ELECTROSURGICAL) ×3
ELECTRODE REM PT RTRN 9FT ADLT (ELECTROSURGICAL) ×1 IMPLANT
GAUZE SPONGE 4X4 12PLY STRL (GAUZE/BANDAGES/DRESSINGS) ×3 IMPLANT
GAUZE SPONGE 4X4 12PLY STRL LF (GAUZE/BANDAGES/DRESSINGS) ×3 IMPLANT
GLOVE BIOGEL M 8.0 STRL (GLOVE) ×3 IMPLANT
GOWN STRL REUS W/ TWL LRG LVL3 (GOWN DISPOSABLE) ×2 IMPLANT
GOWN STRL REUS W/TWL LRG LVL3 (GOWN DISPOSABLE) ×4
IV CATH 22GX1 FEP (IV SOLUTION) ×3 IMPLANT
JET LAVAGE IRRISEPT WOUND (IRRIGATION / IRRIGATOR) ×3
KIT BASIN OR (CUSTOM PROCEDURE TRAY) ×3 IMPLANT
KIT TURNOVER KIT B (KITS) ×3 IMPLANT
LAVAGE JET IRRISEPT WOUND (IRRIGATION / IRRIGATOR) ×1 IMPLANT
NEEDLE HYPO 25GX1X1/2 BEV (NEEDLE) ×3 IMPLANT
NS IRRIG 1000ML POUR BTL (IV SOLUTION) ×3 IMPLANT
PACK ORTHO EXTREMITY (CUSTOM PROCEDURE TRAY) ×3 IMPLANT
PAD ARMBOARD 7.5X6 YLW CONV (MISCELLANEOUS) ×3 IMPLANT
PAD CAST 4YDX4 CTTN HI CHSV (CAST SUPPLIES) ×2 IMPLANT
PADDING CAST COTTON 4X4 STRL (CAST SUPPLIES) ×4
SOAP 2 % CHG 4 OZ (WOUND CARE) ×3 IMPLANT
SPONGE LAP 4X18 RFD (DISPOSABLE) ×3 IMPLANT
SYR CONTROL 10ML LL (SYRINGE) ×3 IMPLANT
TOWEL GREEN STERILE (TOWEL DISPOSABLE) ×3 IMPLANT
TOWEL GREEN STERILE FF (TOWEL DISPOSABLE) ×3 IMPLANT
TUBE CONNECTING 12'X1/4 (SUCTIONS) ×1
TUBE CONNECTING 12X1/4 (SUCTIONS) ×2 IMPLANT
WATER STERILE IRR 1000ML POUR (IV SOLUTION) ×3 IMPLANT
YANKAUER SUCT BULB TIP NO VENT (SUCTIONS) ×3 IMPLANT

## 2020-02-20 NOTE — Transfer of Care (Signed)
Immediate Anesthesia Transfer of Care Note  Patient: MONTGOMERY FAVOR  Procedure(s) Performed: IRRIGATION AND DEBRIDEMENT EXTREMITY (Left Finger)  Patient Location: PACU  Anesthesia Type:MAC  Level of Consciousness: awake, alert  and oriented  Airway & Oxygen Therapy: Patient Spontanous Breathing  Post-op Assessment: Report given to RN and Post -op Vital signs reviewed and stable  Post vital signs: Reviewed and stable  Last Vitals:  Vitals Value Taken Time  BP 110/66 02/20/20 2143  Temp 36.2 C 02/20/20 2143  Pulse 46 02/20/20 2145  Resp 11 02/20/20 2145  SpO2 100 % 02/20/20 2145  Vitals shown include unvalidated device data.  Last Pain:  Vitals:   02/20/20 2143  TempSrc:   PainSc: (P) 0-No pain         Complications: No complications documented.

## 2020-02-20 NOTE — OR Nursing (Signed)
2 specimen cups of Aricept labeled and sent with patient for wound care at home, per order of Dr Izora Ribas.

## 2020-02-20 NOTE — Consult Note (Signed)
Reason for Consult:Left long finger infection Referring Physician: D DONNALD Shepard is an 24 y.o. male.  HPI: Christopher Shepard cut his finger a few days ago on a small metal stove. He cut it on the volar DIP joint. It did ok for a day or so then began to swell and hurt. He denies fevers, chills, N/V but did have sweats last night. He is RHD.  History reviewed. No pertinent past medical history.  History reviewed. No pertinent surgical history.  No family history on file.  Social History:  reports that he has never smoked. He does not have any smokeless tobacco history on file. He reports that he does not drink alcohol. No history on file for drug use.  Allergies: No Known Allergies  Medications: I have reviewed the patient's current medications.  No results found for this or any previous visit (from the past 48 hour(s)).  No results found.  Review of Systems  Constitutional: Positive for diaphoresis. Negative for chills and fever.  HENT: Negative for ear discharge, ear pain, hearing loss and tinnitus.   Eyes: Negative for photophobia and pain.  Respiratory: Negative for cough and shortness of breath.   Cardiovascular: Negative for chest pain.  Gastrointestinal: Negative for abdominal pain, nausea and vomiting.  Genitourinary: Negative for dysuria, flank pain, frequency and urgency.  Musculoskeletal: Positive for arthralgias (Left long finger). Negative for back pain, myalgias and neck pain.  Neurological: Negative for dizziness and headaches.  Hematological: Does not bruise/bleed easily.  Psychiatric/Behavioral: The patient is not nervous/anxious.    Blood pressure (!) 109/58, pulse 92, temperature 98.2 F (36.8 C), resp. rate 16, SpO2 100 %. Physical Exam Constitutional:      General: He is not in acute distress.    Appearance: He is well-developed. He is not diaphoretic.  HENT:     Head: Normocephalic and atraumatic.  Eyes:     General: No scleral icterus.       Right eye:  No discharge.        Left eye: No discharge.     Conjunctiva/sclera: Conjunctivae normal.  Cardiovascular:     Rate and Rhythm: Normal rate and regular rhythm.  Pulmonary:     Effort: Pulmonary effort is normal. No respiratory distress.  Musculoskeletal:     Cervical back: Normal range of motion.     Comments: Left shoulder, elbow, wrist, digits- no skin wounds, long finger fusiform edema, held in flexed position, pain with passive extension, and TTP along flexor sheath into hand, no instability, no blocks to motion  Sens  Ax intact R/M/U paresthetic  Mot   Ax/ R/ PIN/ M/ AIN/ U intact  Rad 2+  Skin:    General: Skin is warm and dry.  Neurological:     Mental Status: He is alert.  Psychiatric:        Behavior: Behavior normal.     Assessment/Plan: Left long finger tenosynovitis -- Plan I&Christopher by Dr. Izora Ribas this evening. Please keep NPO.    Freeman Caldron, PA-C Orthopedic Surgery [phone] 02/20/2020, 4:05 PM

## 2020-02-20 NOTE — Discharge Instructions (Signed)
Remove dressing and packing from palm and finger in am, irrigate both wounds with 10cc fluid (sent home with patient) 3 x a day, until solution gone.  After irrigation, re wrap wounds to keep covered.  May get finger/wounds wet in shower.  May wash wounds with soap and water.

## 2020-02-20 NOTE — Anesthesia Postprocedure Evaluation (Signed)
Anesthesia Post Note  Patient: Christopher Shepard  Procedure(s) Performed: IRRIGATION AND DEBRIDEMENT EXTREMITY (Left Finger)     Patient location during evaluation: PACU Anesthesia Type: MAC Level of consciousness: awake and alert Pain management: pain level controlled Vital Signs Assessment: post-procedure vital signs reviewed and stable Respiratory status: spontaneous breathing, nonlabored ventilation, respiratory function stable and patient connected to nasal cannula oxygen Cardiovascular status: stable and blood pressure returned to baseline Postop Assessment: no apparent nausea or vomiting Anesthetic complications: no   No complications documented.  Last Vitals:  Vitals:   02/20/20 2245 02/20/20 2300  BP: 122/70 128/73  Pulse: (!) 48 (!) 46  Resp: 12 19  Temp:    SpO2: 100% 100%    Last Pain:  Vitals:   02/20/20 2245  TempSrc:   PainSc: 0-No pain                 Belenda Cruise P Amantha Sklar

## 2020-02-20 NOTE — OR Nursing (Signed)
Esmark tourniquet down/off at 21:25pm. Skin warm/dry/intact.

## 2020-02-20 NOTE — Anesthesia Preprocedure Evaluation (Addendum)
Anesthesia Evaluation  Patient identified by MRN, date of birth, ID band  Reviewed: Patient's Chart, lab work & pertinent test results  Airway Mallampati: II  TM Distance: >3 FB Neck ROM: Full    Dental  (+) Teeth Intact, Missing,    Pulmonary neg pulmonary ROS,    Pulmonary exam normal        Cardiovascular negative cardio ROS   Rhythm:Regular Rate:Normal     Neuro/Psych    GI/Hepatic negative GI ROS, Neg liver ROS,   Endo/Other  negative endocrine ROS  Renal/GU negative Renal ROS  negative genitourinary   Musculoskeletal Left finger cut volar DIP joint   Abdominal (+)  Abdomen: soft. Bowel sounds: normal.  Peds negative pediatric ROS (+)  Hematology negative hematology ROS (+)   Anesthesia Other Findings   Reproductive/Obstetrics                            Anesthesia Physical Anesthesia Plan  ASA: II  Anesthesia Plan: MAC   Post-op Pain Management:    Induction:   PONV Risk Score and Plan: 1 and Ondansetron, Dexamethasone and Propofol infusion  Airway Management Planned: Simple Face Mask and Nasal Cannula  Additional Equipment: None  Intra-op Plan:   Post-operative Plan:   Informed Consent: I have reviewed the patients History and Physical, chart, labs and discussed the procedure including the risks, benefits and alternatives for the proposed anesthesia with the patient or authorized representative who has indicated his/her understanding and acceptance.     Dental advisory given  Plan Discussed with: CRNA and Surgeon  Anesthesia Plan Comments: (Lab Results      Component                Value               Date                      WBC                      19.6 (H)            10/29/2013                HGB                      14.2                10/29/2013                HCT                      41.5                10/29/2013                MCV                       86.5                10/29/2013                PLT                      272                 10/29/2013           )  Anesthesia Quick Evaluation  

## 2020-02-20 NOTE — Progress Notes (Signed)
Left a message for Christopher Shepard patients ride.

## 2020-02-20 NOTE — OR Nursing (Signed)
Esmark tourniquet applied to left forearm.

## 2020-02-20 NOTE — ED Triage Notes (Signed)
Pt reports cutting his L middle finger while moving some furniture 3 days ago. Finger now swollen and patient unable to straighten it out, some pain down into hand at this time as well.

## 2020-02-20 NOTE — Progress Notes (Signed)
Pt seen and examined, all questions answered in regards to his surgery.  I will proceed with I&D as scheduled.

## 2020-02-20 NOTE — ED Provider Notes (Signed)
[facility] EMERGENCY DEPARTMENT Provider Note   CSN: [id number] Arrival date & time: 02/20/20  1434     History Chief Complaint  Patient presents with  . Finger Injury    Christopher Shepard is a 24 y.o. male.  Patient received a laceration to palmer aspect of his right middle finger, distal PIP joint, three days ago. Over the course of the last three days, patient has experienced increased swelling to the finger, is unable to fully extend, with increase in pain with movement. Pain radiates to palm of hand.  The history is provided by the patient. No language interpreter was used.  Hand Pain This is a new problem. The current episode started more than 2 days ago. The problem has been gradually worsening.       History reviewed. No pertinent past medical history.  There are no problems to display for this patient.   History reviewed. No pertinent surgical history.     No family history on file.  Social History   Tobacco Use  . Smoking status: Never Smoker  Substance Use Topics  . Alcohol use: No  . Drug use: Not on file    Home Medications Prior to Admission medications   Medication Sig Start Date End Date Taking? Authorizing Provider  clindamycin (CLEOCIN) 150 MG capsule Take 3 capsules (450 mg total) by mouth 3 (three) times daily. 10/29/13   Sciacca, Marissa, PA-C  HYDROcodone-acetaminophen (NORCO/VICODIN) 5-325 MG per tablet Take 1 tablet by mouth every 4 (four) hours as needed for moderate pain or severe pain. 11/12/13   Kirichenko, Tatyana, PA-C  ibuprofen (ADVIL,MOTRIN) 200 MG tablet Take 200 mg by mouth every 6 (six) hours as needed for moderate pain.    [provider]  ibuprofen (ADVIL,MOTRIN) 600 MG tablet Take 1 tablet (600 mg total) by mouth every 6 (six) hours as needed. 11/12/13   [name], PA-C    Allergies    Patient has no known allergies.  Review of Systems   Review of Systems  Musculoskeletal: Positive for  arthralgias.  Skin: Positive for wound.  All other systems reviewed and are negative.   Physical Exam Updated Vital Signs BP (!) 109/58   Pulse 92   Temp 98.2 F (36.8 C)   Resp 16   SpO2 100%   Physical Exam Vitals and nursing note reviewed.  Constitutional:      Appearance: Normal appearance.  HENT:     Head: Normocephalic.     Nose: Nose normal.  Eyes:     Conjunctiva/sclera: Conjunctivae normal.  Cardiovascular:     Rate and Rhythm: Normal rate and regular rhythm.  Pulmonary:     Effort: Pulmonary effort is normal.     Breath sounds: Normal breath sounds.  Musculoskeletal:        General: Swelling, tenderness and signs of injury present.  Skin:    General: Skin is warm and dry.  Neurological:     Mental Status: He is alert and oriented to person, place, and time.  Psychiatric:        Mood and Affect: Mood normal.        Behavior: Behavior normal.     ED Results / Procedures / Treatments   Labs (all labs ordered are listed, but only abnormal results are displayed) Labs Reviewed - No data to display  EKG None  Radiology DG Finger Middle Left  Result Date: 02/20/2020 CLINICAL DATA:  24 year old male status post laceration 3 days ago with continued  pain and swelling. EXAM: LEFT MIDDLE FINGER 2+V COMPARISON:  None. FINDINGS: Bone mineralization is within normal limits. There is no evidence of fracture or dislocation. There is no evidence of arthropathy or other focal bone abnormality. Generalized soft tissue swelling. No soft tissue gas. No radiopaque foreign body identified. IMPRESSION: Soft tissue swelling with no retained radiopaque foreign body or osseous abnormality identified. Electronically Signed   By: Odessa Fleming M.D.   On: 02/20/2020 16:26    Procedures Procedures (including critical care time)  Medications Ordered in ED Medications - No data to display  ED Course  I have reviewed the triage vital signs and the nursing notes.  Pertinent labs &  imaging results that were available during my care of the patient were reviewed by me and considered in my medical decision making (see chart for details).     1550: Suspicion for tenosyvitis. Contacted Earney Hamburg, [place] for consult. He will be in to see patient.  1605: Patient had been seen by ortho PA. Plan is for I&D by Dr. Izora Ribas today.     MDM Rules/Calculators/A&P                          Patient with findings consistent with tenosyvitis. Patient seen by hand, with I&D in the OR scheduled for this evening. Final Clinical Impression(s) / ED Diagnoses Final diagnoses:  Tenosynovitis of finger    Rx / DC Orders ED Discharge Orders    None       Felicie Morn, NP 02/20/20 1832    Melene Plan, DO 02/23/20 1505

## 2020-02-21 ENCOUNTER — Encounter (HOSPITAL_COMMUNITY): Payer: Self-pay | Admitting: General Surgery
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
## 2020-07-08 DIAGNOSIS — Z419 Encounter for procedure for purposes other than remedying health state, unspecified: Secondary | ICD-10-CM | POA: Diagnosis not present

## 2020-08-08 DIAGNOSIS — Z419 Encounter for procedure for purposes other than remedying health state, unspecified: Secondary | ICD-10-CM | POA: Diagnosis not present

## 2020-09-07 DIAGNOSIS — Z419 Encounter for procedure for purposes other than remedying health state, unspecified: Secondary | ICD-10-CM | POA: Diagnosis not present

## 2020-10-08 DIAGNOSIS — Z419 Encounter for procedure for purposes other than remedying health state, unspecified: Secondary | ICD-10-CM | POA: Diagnosis not present

## 2020-11-07 DIAGNOSIS — Z419 Encounter for procedure for purposes other than remedying health state, unspecified: Secondary | ICD-10-CM | POA: Diagnosis not present

## 2020-12-08 DIAGNOSIS — Z419 Encounter for procedure for purposes other than remedying health state, unspecified: Secondary | ICD-10-CM | POA: Diagnosis not present

## 2021-01-08 DIAGNOSIS — Z419 Encounter for procedure for purposes other than remedying health state, unspecified: Secondary | ICD-10-CM | POA: Diagnosis not present

## 2021-02-07 DIAGNOSIS — Z419 Encounter for procedure for purposes other than remedying health state, unspecified: Secondary | ICD-10-CM | POA: Diagnosis not present

## 2021-02-28 ENCOUNTER — Encounter (HOSPITAL_COMMUNITY): Admission: EM | Disposition: A | Discharge status: Home / Self Care | Payer: Self-pay | Source: Home / Self Care

## 2021-02-28 ENCOUNTER — Encounter (HOSPITAL_COMMUNITY): Payer: Self-pay | Admitting: *Deleted

## 2021-02-28 ENCOUNTER — Inpatient Hospital Stay (HOSPITAL_COMMUNITY)
Admission: EM | Admit: 2021-02-28 | Discharge: 2021-03-02 | DRG: 958 | Disposition: A | Discharge status: Home / Self Care | Payer: Medicaid Other | Attending: Surgery | Admitting: Surgery

## 2021-02-28 ENCOUNTER — Emergency Department (HOSPITAL_COMMUNITY): Payer: Medicaid Other

## 2021-02-28 ENCOUNTER — Inpatient Hospital Stay (HOSPITAL_COMMUNITY): Payer: Medicaid Other | Admitting: Anesthesiology

## 2021-02-28 ENCOUNTER — Other Ambulatory Visit: Payer: Self-pay

## 2021-02-28 DIAGNOSIS — I495 Sick sinus syndrome: Secondary | ICD-10-CM | POA: Diagnosis not present

## 2021-02-28 DIAGNOSIS — T1490XA Injury, unspecified, initial encounter: Secondary | ICD-10-CM

## 2021-02-28 DIAGNOSIS — Z743 Need for continuous supervision: Secondary | ICD-10-CM | POA: Diagnosis not present

## 2021-02-28 DIAGNOSIS — S71131A Puncture wound without foreign body, right thigh, initial encounter: Secondary | ICD-10-CM | POA: Diagnosis present

## 2021-02-28 DIAGNOSIS — Z23 Encounter for immunization: Secondary | ICD-10-CM | POA: Diagnosis not present

## 2021-02-28 DIAGNOSIS — S71111A Laceration without foreign body, right thigh, initial encounter: Secondary | ICD-10-CM | POA: Diagnosis not present

## 2021-02-28 DIAGNOSIS — S42101A Fracture of unspecified part of scapula, right shoulder, initial encounter for closed fracture: Secondary | ICD-10-CM | POA: Diagnosis not present

## 2021-02-28 DIAGNOSIS — S55091A Other specified injury of ulnar artery at forearm level, right arm, initial encounter: Secondary | ICD-10-CM

## 2021-02-28 DIAGNOSIS — S81811A Laceration without foreign body, right lower leg, initial encounter: Secondary | ICD-10-CM | POA: Diagnosis present

## 2021-02-28 DIAGNOSIS — S41131A Puncture wound without foreign body of right upper arm, initial encounter: Secondary | ICD-10-CM | POA: Diagnosis present

## 2021-02-28 DIAGNOSIS — E876 Hypokalemia: Secondary | ICD-10-CM | POA: Diagnosis not present

## 2021-02-28 DIAGNOSIS — Z20822 Contact with and (suspected) exposure to covid-19: Secondary | ICD-10-CM | POA: Diagnosis present

## 2021-02-28 DIAGNOSIS — S0101XA Laceration without foreign body of scalp, initial encounter: Secondary | ICD-10-CM | POA: Diagnosis not present

## 2021-02-28 DIAGNOSIS — R109 Unspecified abdominal pain: Secondary | ICD-10-CM | POA: Diagnosis present

## 2021-02-28 DIAGNOSIS — S42191B Fracture of other part of scapula, right shoulder, initial encounter for open fracture: Secondary | ICD-10-CM

## 2021-02-28 DIAGNOSIS — W3400XA Accidental discharge from unspecified firearms or gun, initial encounter: Secondary | ICD-10-CM

## 2021-02-28 DIAGNOSIS — T07XXXA Unspecified multiple injuries, initial encounter: Secondary | ICD-10-CM | POA: Diagnosis not present

## 2021-02-28 DIAGNOSIS — S42111A Displaced fracture of body of scapula, right shoulder, initial encounter for closed fracture: Secondary | ICD-10-CM | POA: Diagnosis present

## 2021-02-28 DIAGNOSIS — T79A11A Traumatic compartment syndrome of right upper extremity, initial encounter: Principal | ICD-10-CM | POA: Diagnosis present

## 2021-02-28 DIAGNOSIS — S51831A Puncture wound without foreign body of right forearm, initial encounter: Secondary | ICD-10-CM | POA: Diagnosis present

## 2021-02-28 DIAGNOSIS — S31000A Unspecified open wound of lower back and pelvis without penetration into retroperitoneum, initial encounter: Secondary | ICD-10-CM | POA: Diagnosis not present

## 2021-02-28 DIAGNOSIS — S41101A Unspecified open wound of right upper arm, initial encounter: Secondary | ICD-10-CM | POA: Diagnosis not present

## 2021-02-28 DIAGNOSIS — T79A0XA Compartment syndrome, unspecified, initial encounter: Secondary | ICD-10-CM | POA: Diagnosis present

## 2021-02-28 DIAGNOSIS — M79601 Pain in right arm: Secondary | ICD-10-CM | POA: Diagnosis present

## 2021-02-28 DIAGNOSIS — R0602 Shortness of breath: Secondary | ICD-10-CM | POA: Diagnosis present

## 2021-02-28 DIAGNOSIS — S21109A Unspecified open wound of unspecified front wall of thorax without penetration into thoracic cavity, initial encounter: Secondary | ICD-10-CM | POA: Diagnosis not present

## 2021-02-28 DIAGNOSIS — S1190XA Unspecified open wound of unspecified part of neck, initial encounter: Secondary | ICD-10-CM | POA: Diagnosis not present

## 2021-02-28 DIAGNOSIS — S27321A Contusion of lung, unilateral, initial encounter: Secondary | ICD-10-CM | POA: Diagnosis present

## 2021-02-28 DIAGNOSIS — S31609A Unspecified open wound of abdominal wall, unspecified quadrant with penetration into peritoneal cavity, initial encounter: Secondary | ICD-10-CM | POA: Diagnosis not present

## 2021-02-28 DIAGNOSIS — S71101A Unspecified open wound, right thigh, initial encounter: Secondary | ICD-10-CM | POA: Diagnosis not present

## 2021-02-28 DIAGNOSIS — F17210 Nicotine dependence, cigarettes, uncomplicated: Secondary | ICD-10-CM | POA: Diagnosis not present

## 2021-02-28 DIAGNOSIS — G5601 Carpal tunnel syndrome, right upper limb: Secondary | ICD-10-CM | POA: Diagnosis not present

## 2021-02-28 DIAGNOSIS — D62 Acute posthemorrhagic anemia: Secondary | ICD-10-CM | POA: Diagnosis not present

## 2021-02-28 DIAGNOSIS — S51801A Unspecified open wound of right forearm, initial encounter: Secondary | ICD-10-CM | POA: Diagnosis not present

## 2021-02-28 DIAGNOSIS — S0190XA Unspecified open wound of unspecified part of head, initial encounter: Secondary | ICD-10-CM | POA: Diagnosis not present

## 2021-02-28 HISTORY — PX: WOUND EXPLORATION: SHX6188

## 2021-02-28 HISTORY — PX: FASCIECTOMY: SHX6525

## 2021-02-28 HISTORY — PX: CARPAL TUNNEL RELEASE: SHX101

## 2021-02-28 LAB — CBC
HCT: 39.2 % (ref 39.0–52.0)
Hemoglobin: 12.7 g/dL — ABNORMAL LOW (ref 13.0–17.0)
MCH: 29.9 pg (ref 26.0–34.0)
MCHC: 32.4 g/dL (ref 30.0–36.0)
MCV: 92.2 fL (ref 80.0–100.0)
Platelets: 213 10*3/uL (ref 150–400)
RBC: 4.25 MIL/uL (ref 4.22–5.81)
RDW: 12.8 % (ref 11.5–15.5)
WBC: 16.1 10*3/uL — ABNORMAL HIGH (ref 4.0–10.5)
nRBC: 0 % (ref 0.0–0.2)

## 2021-02-28 LAB — COMPREHENSIVE METABOLIC PANEL
ALT: 16 U/L (ref 0–44)
AST: 33 U/L (ref 15–41)
Albumin: 3.7 g/dL (ref 3.5–5.0)
Alkaline Phosphatase: 64 U/L (ref 38–126)
Anion gap: 13 (ref 5–15)
BUN: 12 mg/dL (ref 6–20)
CO2: 16 mmol/L — ABNORMAL LOW (ref 22–32)
Calcium: 8.7 mg/dL — ABNORMAL LOW (ref 8.9–10.3)
Chloride: 107 mmol/L (ref 98–111)
Creatinine, Ser: 1.17 mg/dL (ref 0.61–1.24)
GFR, Estimated: 42 mL/min — ABNORMAL LOW (ref >60)
Glucose, Bld: 201 mg/dL — ABNORMAL HIGH (ref 70–99)
Potassium: 3.2 mmol/L — ABNORMAL LOW (ref 3.5–5.1)
Sodium: 136 mmol/L (ref 135–145)
Total Bilirubin: 1 mg/dL (ref 0.3–1.2)
Total Protein: 6 g/dL — ABNORMAL LOW (ref 6.5–8.1)

## 2021-02-28 LAB — CREATININE, SERUM
Creatinine, Ser: 1.15 mg/dL (ref 0.61–1.24)
GFR, Estimated: >60 mL/min (ref >60)

## 2021-02-28 LAB — RESP PANEL BY RT-PCR (FLU A&B, COVID) ARPGX2
Influenza A by PCR: NEGATIVE
Influenza B by PCR: NEGATIVE
SARS Coronavirus 2 by RT PCR: NEGATIVE

## 2021-02-28 LAB — PROTIME-INR
INR: 1.4 — ABNORMAL HIGH (ref 0.8–1.2)
Prothrombin Time: 16.7 seconds — ABNORMAL HIGH (ref 11.4–15.2)

## 2021-02-28 LAB — SAMPLE TO BLOOD BANK

## 2021-02-28 LAB — HIV ANTIBODY (ROUTINE TESTING W REFLEX): HIV Screen 4th Generation wRfx: NONREACTIVE

## 2021-02-28 LAB — ETHANOL: Alcohol, Ethyl (B): <10 mg/dL (ref <10)

## 2021-02-28 LAB — LACTIC ACID, PLASMA: Lactic Acid, Venous: 1.9 mmol/L (ref 0.5–1.9)

## 2021-02-28 SURGERY — WOUND EXPLORATION
Anesthesia: General | Site: Arm Lower | Laterality: Right

## 2021-02-28 MED ORDER — DOCUSATE SODIUM 100 MG PO CAPS: 100.0000 mg | ORAL_CAPSULE | Freq: Two times a day (BID) | ORAL | Status: DC

## 2021-02-28 MED ORDER — ONDANSETRON 4 MG PO TBDP
4.0000 mg | ORAL_TABLET | Freq: Four times a day (QID) | ORAL | Status: DC | PRN
  Administered 2021-03-01: 4 mg via ORAL
  Filled 2021-02-28: qty 1

## 2021-02-28 MED ORDER — ONDANSETRON 4 MG PO TBDP: 4.0000 mg | ORAL_TABLET | Freq: Four times a day (QID) | ORAL | Status: DC | PRN

## 2021-02-28 MED ORDER — HYDROMORPHONE HCL 1 MG/ML IJ SOLN
0.2500 mg | INTRAMUSCULAR | Status: DC | PRN
  Administered 2021-02-28 (×2): 0.5 mg via INTRAVENOUS

## 2021-02-28 MED ORDER — ENOXAPARIN SODIUM 30 MG/0.3ML IJ SOSY: 30.0000 mg | PREFILLED_SYRINGE | Freq: Two times a day (BID) | INTRAMUSCULAR | Status: DC

## 2021-02-28 MED ORDER — CEFAZOLIN SODIUM-DEXTROSE 2-3 GM-%(50ML) IV SOLR
INTRAVENOUS | Status: DC | PRN
  Administered 2021-02-28: 2 g via INTRAVENOUS

## 2021-02-28 MED ORDER — LACTATED RINGERS IV SOLN: INTRAVENOUS | Status: DC | PRN

## 2021-02-28 MED ORDER — LIDOCAINE HCL (PF) 1 % IJ SOLN
INTRAMUSCULAR | Status: AC
  Administered 2021-02-28: 30 mL
  Filled 2021-02-28: qty 30

## 2021-02-28 MED ORDER — DEXAMETHASONE SODIUM PHOSPHATE 10 MG/ML IJ SOLN
INTRAMUSCULAR | Status: AC
  Filled 2021-02-28: qty 1

## 2021-02-28 MED ORDER — HYDRALAZINE HCL 20 MG/ML IJ SOLN: 5.0000 mg | INTRAMUSCULAR | Status: DC | PRN

## 2021-02-28 MED ORDER — ACETAMINOPHEN 325 MG PO TABS: 650.0000 mg | ORAL_TABLET | ORAL | Status: DC | PRN

## 2021-02-28 MED ORDER — HEPARIN 6000 UNIT IRRIGATION SOLUTION
Status: AC
  Filled 2021-02-28: qty 500

## 2021-02-28 MED ORDER — ONDANSETRON HCL 4 MG/2ML IJ SOLN: 4.0000 mg | Freq: Four times a day (QID) | INTRAMUSCULAR | Status: DC | PRN

## 2021-02-28 MED ORDER — ACETAMINOPHEN 500 MG PO TABS: 1000.0000 mg | ORAL_TABLET | Freq: Once | ORAL | Status: DC | PRN

## 2021-02-28 MED ORDER — ROCURONIUM BROMIDE 10 MG/ML (PF) SYRINGE
PREFILLED_SYRINGE | INTRAVENOUS | Status: AC
  Filled 2021-02-28: qty 10

## 2021-02-28 MED ORDER — SODIUM CHLORIDE 0.9 % IV SOLN
500.0000 mL | Freq: Once | INTRAVENOUS | Status: DC | PRN
Start: 2021-02-28 — End: 2021-03-02

## 2021-02-28 MED ORDER — HYDROMORPHONE HCL 1 MG/ML IJ SOLN
0.5000 mg | INTRAMUSCULAR | Status: DC | PRN
  Administered 2021-02-28 – 2021-03-02 (×3): 1 mg via INTRAVENOUS
  Filled 2021-02-28 (×3): qty 1

## 2021-02-28 MED ORDER — HYDROMORPHONE HCL 1 MG/ML IJ SOLN
INTRAMUSCULAR | Status: AC
  Filled 2021-02-28: qty 1

## 2021-02-28 MED ORDER — METHOCARBAMOL 500 MG PO TABS
1000.0000 mg | ORAL_TABLET | Freq: Three times a day (TID) | ORAL | Status: DC
  Administered 2021-02-28 – 2021-03-02 (×7): 1000 mg via ORAL
  Filled 2021-02-28 (×7): qty 2

## 2021-02-28 MED ORDER — ONDANSETRON HCL 4 MG/2ML IJ SOLN
INTRAMUSCULAR | Status: DC | PRN
  Administered 2021-02-28: 4 mg via INTRAVENOUS

## 2021-02-28 MED ORDER — FENTANYL CITRATE (PF) 250 MCG/5ML IJ SOLN
INTRAMUSCULAR | Status: AC
  Filled 2021-02-28: qty 5

## 2021-02-28 MED ORDER — PROPOFOL 10 MG/ML IV BOLUS
INTRAVENOUS | Status: AC
  Filled 2021-02-28: qty 20

## 2021-02-28 MED ORDER — MIDAZOLAM HCL 2 MG/2ML IJ SOLN
INTRAMUSCULAR | Status: AC
  Filled 2021-02-28: qty 2

## 2021-02-28 MED ORDER — PROCHLORPERAZINE EDISYLATE 10 MG/2ML IJ SOLN: 5.0000 mg | Freq: Four times a day (QID) | INTRAMUSCULAR | Status: DC | PRN

## 2021-02-28 MED ORDER — PHENYLEPHRINE 40 MCG/ML (10ML) SYRINGE FOR IV PUSH (FOR BLOOD PRESSURE SUPPORT)
PREFILLED_SYRINGE | INTRAVENOUS | Status: AC
  Filled 2021-02-28: qty 10

## 2021-02-28 MED ORDER — LABETALOL HCL 5 MG/ML IV SOLN
10.0000 mg | INTRAVENOUS | Status: DC | PRN
Start: 2021-02-28 — End: 2021-03-03

## 2021-02-28 MED ORDER — DEXTROSE IN LACTATED RINGERS 5 % IV SOLN: INTRAVENOUS | Status: AC

## 2021-02-28 MED ORDER — PHENYLEPHRINE 40 MCG/ML (10ML) SYRINGE FOR IV PUSH (FOR BLOOD PRESSURE SUPPORT)
PREFILLED_SYRINGE | INTRAVENOUS | Status: DC | PRN
  Administered 2021-02-28: 80 ug via INTRAVENOUS
  Administered 2021-02-28: 40 ug via INTRAVENOUS

## 2021-02-28 MED ORDER — LIDOCAINE HCL (CARDIAC) PF 100 MG/5ML IV SOSY
PREFILLED_SYRINGE | INTRAVENOUS | Status: DC | PRN
  Administered 2021-02-28: 60 mg via INTRAVENOUS

## 2021-02-28 MED ORDER — ALUM & MAG HYDROXIDE-SIMETH 200-200-20 MG/5ML PO SUSP: 15.0000 mL | ORAL | Status: DC | PRN

## 2021-02-28 MED ORDER — PROPOFOL 10 MG/ML IV BOLUS
INTRAVENOUS | Status: DC | PRN
  Administered 2021-02-28: 120 mg via INTRAVENOUS

## 2021-02-28 MED ORDER — ROCURONIUM 10MG/ML (10ML) SYRINGE FOR MEDFUSION PUMP - OPTIME
INTRAVENOUS | Status: DC | PRN
  Administered 2021-02-28: 20 mg via INTRAVENOUS
  Administered 2021-02-28: 50 mg via INTRAVENOUS
  Administered 2021-02-28: 10 mg via INTRAVENOUS
  Administered 2021-02-28: 20 mg via INTRAVENOUS

## 2021-02-28 MED ORDER — INFLUENZA VAC SPLIT QUAD 0.5 ML IM SUSY
0.5000 mL | PREFILLED_SYRINGE | INTRAMUSCULAR | Status: DC
  Filled 2021-02-28: qty 0.5

## 2021-02-28 MED ORDER — ENOXAPARIN SODIUM 30 MG/0.3ML IJ SOSY
30.0000 mg | PREFILLED_SYRINGE | Freq: Two times a day (BID) | INTRAMUSCULAR | Status: DC
  Administered 2021-03-02: 30 mg via SUBCUTANEOUS
  Filled 2021-02-28: qty 0.3

## 2021-02-28 MED ORDER — ACETAMINOPHEN 10 MG/ML IV SOLN: 1000.0000 mg | Freq: Once | INTRAVENOUS | Status: DC | PRN

## 2021-02-28 MED ORDER — 0.9 % SODIUM CHLORIDE (POUR BTL) OPTIME
TOPICAL | Status: DC | PRN
  Administered 2021-02-28: 1000 mL

## 2021-02-28 MED ORDER — OXYCODONE HCL 5 MG PO TABS: 5.0000 mg | ORAL_TABLET | ORAL | Status: DC | PRN

## 2021-02-28 MED ORDER — MELATONIN 3 MG PO TABS
3.0000 mg | ORAL_TABLET | Freq: Every evening | ORAL | Status: DC | PRN
  Administered 2021-02-28 – 2021-03-01 (×2): 3 mg via ORAL
  Filled 2021-02-28 (×2): qty 1

## 2021-02-28 MED ORDER — TETANUS-DIPHTH-ACELL PERTUSSIS 5-2.5-18.5 LF-MCG/0.5 IM SUSY
0.5000 mL | PREFILLED_SYRINGE | Freq: Once | INTRAMUSCULAR | Status: AC
  Administered 2021-02-28: 0.5 mL via INTRAMUSCULAR
  Filled 2021-02-28: qty 0.5

## 2021-02-28 MED ORDER — MORPHINE SULFATE (PF) 4 MG/ML IV SOLN: 4.0000 mg | INTRAVENOUS | Status: DC | PRN

## 2021-02-28 MED ORDER — SUGAMMADEX SODIUM 200 MG/2ML IV SOLN
INTRAVENOUS | Status: DC | PRN
  Administered 2021-02-28: 150 mg via INTRAVENOUS

## 2021-02-28 MED ORDER — HYDROMORPHONE HCL 1 MG/ML IJ SOLN
INTRAMUSCULAR | Status: AC | PRN
  Administered 2021-02-28 (×2): 1 mg via INTRAVENOUS

## 2021-02-28 MED ORDER — PANTOPRAZOLE SODIUM 40 MG PO TBEC
40.0000 mg | DELAYED_RELEASE_TABLET | Freq: Every day | ORAL | Status: DC
  Administered 2021-02-28 – 2021-03-02 (×3): 40 mg via ORAL
  Filled 2021-02-28 (×3): qty 1

## 2021-02-28 MED ORDER — PAPAVERINE HCL 30 MG/ML IJ SOLN
INTRAMUSCULAR | Status: AC
  Filled 2021-02-28: qty 2

## 2021-02-28 MED ORDER — CEFAZOLIN SODIUM-DEXTROSE 2-4 GM/100ML-% IV SOLN
2.0000 g | Freq: Three times a day (TID) | INTRAVENOUS | Status: AC
  Administered 2021-02-28 (×2): 2 g via INTRAVENOUS
  Filled 2021-02-28 (×2): qty 100

## 2021-02-28 MED ORDER — ONDANSETRON HCL 4 MG/2ML IJ SOLN
INTRAMUSCULAR | Status: AC
  Filled 2021-02-28: qty 2

## 2021-02-28 MED ORDER — HEPARIN SODIUM (PORCINE) 5000 UNIT/ML IJ SOLN: 5000.0000 [IU] | Freq: Three times a day (TID) | INTRAMUSCULAR | Status: DC

## 2021-02-28 MED ORDER — LIDOCAINE HCL (PF) 1 % IJ SOLN: 30.0000 mL | Freq: Once | INTRAMUSCULAR | Status: AC

## 2021-02-28 MED ORDER — GUAIFENESIN-DM 100-10 MG/5ML PO SYRP: 15.0000 mL | ORAL_SOLUTION | ORAL | Status: DC | PRN

## 2021-02-28 MED ORDER — PHENOL 1.4 % MT LIQD: 1.0000 | OROMUCOSAL | Status: DC | PRN

## 2021-02-28 MED ORDER — FENTANYL CITRATE (PF) 250 MCG/5ML IJ SOLN
INTRAMUSCULAR | Status: DC | PRN
  Administered 2021-02-28: 100 ug via INTRAVENOUS
  Administered 2021-02-28 (×3): 50 ug via INTRAVENOUS

## 2021-02-28 MED ORDER — LIDOCAINE 2% (20 MG/ML) 5 ML SYRINGE
INTRAMUSCULAR | Status: AC
  Filled 2021-02-28: qty 5

## 2021-02-28 MED ORDER — DEXAMETHASONE SODIUM PHOSPHATE 10 MG/ML IJ SOLN
INTRAMUSCULAR | Status: DC | PRN
Start: 2021-02-28 — End: 2021-02-28
  Administered 2021-02-28: 10 mg via INTRAVENOUS

## 2021-02-28 MED ORDER — ACETAMINOPHEN 650 MG RE SUPP: 325.0000 mg | RECTAL | Status: DC | PRN

## 2021-02-28 MED ORDER — CEFAZOLIN SODIUM-DEXTROSE 2-4 GM/100ML-% IV SOLN
INTRAVENOUS | Status: AC
  Filled 2021-02-28: qty 100

## 2021-02-28 MED ORDER — OXYCODONE HCL 5 MG PO TABS
5.0000 mg | ORAL_TABLET | ORAL | Status: DC | PRN
  Administered 2021-02-28 – 2021-03-02 (×8): 10 mg via ORAL
  Filled 2021-02-28 (×8): qty 2

## 2021-02-28 MED ORDER — DOCUSATE SODIUM 100 MG PO CAPS: 100.0000 mg | ORAL_CAPSULE | Freq: Every day | ORAL | Status: DC

## 2021-02-28 MED ORDER — SUCCINYLCHOLINE CHLORIDE 200 MG/10ML IV SOSY
PREFILLED_SYRINGE | INTRAVENOUS | Status: AC
  Filled 2021-02-28: qty 10

## 2021-02-28 MED ORDER — SUCCINYLCHOLINE 20MG/ML (10ML) SYRINGE FOR MEDFUSION PUMP - OPTIME
INTRAMUSCULAR | Status: DC | PRN
  Administered 2021-02-28: 140 mg via INTRAVENOUS

## 2021-02-28 MED ORDER — PAPAVERINE HCL 30 MG/ML IJ SOLN
INTRAMUSCULAR | Status: DC | PRN
Start: 2021-02-28 — End: 2021-02-28
  Administered 2021-02-28: 60 mg

## 2021-02-28 MED ORDER — LACTATED RINGERS IV SOLN: INTRAVENOUS | Status: DC

## 2021-02-28 MED ORDER — OXYCODONE HCL 5 MG/5ML PO SOLN: 5.0000 mg | Freq: Once | ORAL | Status: DC | PRN

## 2021-02-28 MED ORDER — ACETAMINOPHEN 160 MG/5ML PO SOLN: 1000.0000 mg | Freq: Once | ORAL | Status: DC | PRN

## 2021-02-28 MED ORDER — PROCHLORPERAZINE MALEATE 10 MG PO TABS
10.0000 mg | ORAL_TABLET | Freq: Four times a day (QID) | ORAL | Status: DC | PRN
  Filled 2021-02-28: qty 1

## 2021-02-28 MED ORDER — METOPROLOL TARTRATE 5 MG/5ML IV SOLN: 2.0000 mg | INTRAVENOUS | Status: DC | PRN

## 2021-02-28 MED ORDER — POTASSIUM CHLORIDE CRYS ER 20 MEQ PO TBCR
20.0000 meq | EXTENDED_RELEASE_TABLET | Freq: Every day | ORAL | Status: AC | PRN
  Administered 2021-02-28: 40 meq via ORAL
  Filled 2021-02-28: qty 2

## 2021-02-28 MED ORDER — DOCUSATE SODIUM 100 MG PO CAPS
100.0000 mg | ORAL_CAPSULE | Freq: Two times a day (BID) | ORAL | Status: DC
  Administered 2021-02-28 – 2021-03-02 (×4): 100 mg via ORAL
  Filled 2021-02-28 (×4): qty 1

## 2021-02-28 MED ORDER — HEPARIN 6000 UNIT IRRIGATION SOLUTION
Status: DC | PRN
  Administered 2021-02-28: 1

## 2021-02-28 MED ORDER — HYDROMORPHONE HCL 1 MG/ML IJ SOLN
1.0000 mg | Freq: Once | INTRAMUSCULAR | Status: AC
  Administered 2021-02-28: 1 mg via INTRAVENOUS
  Filled 2021-02-28: qty 1

## 2021-02-28 MED ORDER — PHENYLEPHRINE HCL-NACL 20-0.9 MG/250ML-% IV SOLN
INTRAVENOUS | Status: DC | PRN
  Administered 2021-02-28: 20 ug/min via INTRAVENOUS

## 2021-02-28 MED ORDER — MAGNESIUM SULFATE 2 GM/50ML IV SOLN: 2.0000 g | Freq: Every day | INTRAVENOUS | Status: DC | PRN

## 2021-02-28 MED ORDER — PHENYLEPHRINE HCL (PRESSORS) 10 MG/ML IV SOLN
INTRAVENOUS | Status: AC
  Filled 2021-02-28: qty 2

## 2021-02-28 MED ORDER — ACETAMINOPHEN 325 MG PO TABS
325.0000 mg | ORAL_TABLET | ORAL | Status: DC | PRN
  Administered 2021-02-28: 650 mg via ORAL
  Filled 2021-02-28: qty 2

## 2021-02-28 MED ORDER — ACETAMINOPHEN 500 MG PO TABS
1000.0000 mg | ORAL_TABLET | Freq: Four times a day (QID) | ORAL | Status: DC
  Administered 2021-02-28 – 2021-03-02 (×6): 1000 mg via ORAL
  Filled 2021-02-28 (×7): qty 2

## 2021-02-28 MED ORDER — IOHEXOL 350 MG/ML SOLN
100.0000 mL | Freq: Once | INTRAVENOUS | Status: AC | PRN
  Administered 2021-02-28: 100 mL via INTRAVENOUS

## 2021-02-28 MED ORDER — OXYCODONE HCL 5 MG PO TABS: 5.0000 mg | ORAL_TABLET | Freq: Once | ORAL | Status: DC | PRN

## 2021-02-28 SURGICAL SUPPLY — 66 items
BAG COUNTER SPONGE SURGICOUNT (BAG) ×3 IMPLANT
BAG SPNG CNTER NS LX DISP (BAG) ×2
BANDAGE ESMARK 6X9 LF (GAUZE/BANDAGES/DRESSINGS) ×2 IMPLANT
BLADE 15 SAFETY STRL DISP (BLADE) ×3 IMPLANT
BNDG CMPR 9X6 STRL LF SNTH (GAUZE/BANDAGES/DRESSINGS) ×2
BNDG ELASTIC 4X5.8 VLCR STR LF (GAUZE/BANDAGES/DRESSINGS) ×3 IMPLANT
BNDG ELASTIC 6X5.8 VLCR STR LF (GAUZE/BANDAGES/DRESSINGS) ×3 IMPLANT
BNDG ESMARK 6X9 LF (GAUZE/BANDAGES/DRESSINGS) ×3
BNDG GAUZE ELAST 4 BULKY (GAUZE/BANDAGES/DRESSINGS) ×6 IMPLANT
CANISTER SUCT 3000ML PPV (MISCELLANEOUS) ×3 IMPLANT
CANNULA VESSEL 3MM 2 BLNT TIP (CANNULA) ×6 IMPLANT
CLIP TI WIDE RED SMALL 24 (CLIP) ×3 IMPLANT
CLIP VESOCCLUDE MED 6/CT (CLIP) ×3 IMPLANT
CLIP VESOCCLUDE SM WIDE 6/CT (CLIP) ×3 IMPLANT
CNTNR URN SCR LID CUP LEK RST (MISCELLANEOUS) ×2 IMPLANT
CONT SPEC 4OZ STRL OR WHT (MISCELLANEOUS) ×3
CORD BIPOLAR FORCEPS 12FT (ELECTRODE) ×3 IMPLANT
COVER SURGICAL LIGHT HANDLE (MISCELLANEOUS) ×3 IMPLANT
CUFF TOURN SGL QUICK 18X4 (TOURNIQUET CUFF) ×3 IMPLANT
DRAPE HALF SHEET 40X57 (DRAPES) IMPLANT
DRAPE INCISE IOBAN 66X45 STRL (DRAPES) ×3 IMPLANT
DRAPE U-SHAPE 76X120 STRL (DRAPES) IMPLANT
DRSG PAD ABDOMINAL 8X10 ST (GAUZE/BANDAGES/DRESSINGS) ×12 IMPLANT
ELECT REM PT RETURN 9FT ADLT (ELECTROSURGICAL) ×3
ELECTRODE REM PT RTRN 9FT ADLT (ELECTROSURGICAL) ×2 IMPLANT
GAUZE 4X4 16PLY ~~LOC~~+RFID DBL (SPONGE) ×6 IMPLANT
GAUZE SPONGE 4X4 12PLY STRL (GAUZE/BANDAGES/DRESSINGS) ×6 IMPLANT
GAUZE XEROFORM 5X9 LF (GAUZE/BANDAGES/DRESSINGS) ×3 IMPLANT
GLOVE SRG 8 PF TXTR STRL LF DI (GLOVE) ×4 IMPLANT
GLOVE SURG ENC MOIS LTX SZ7.5 (GLOVE) ×3 IMPLANT
GLOVE SURG MICRO LTX SZ6.5 (GLOVE) ×3 IMPLANT
GLOVE SURG POLY ORTHO LF SZ7.5 (GLOVE) IMPLANT
GLOVE SURG POLYISO LF SZ6.5 (GLOVE) ×3 IMPLANT
GLOVE SURG POLYISO LF SZ7.5 (GLOVE) ×3 IMPLANT
GLOVE SURG UNDER LTX SZ8 (GLOVE) ×6 IMPLANT
GLOVE SURG UNDER POLY LF SZ8 (GLOVE) ×6
GOWN STRL REUS W/ TWL LRG LVL3 (GOWN DISPOSABLE) ×4 IMPLANT
GOWN STRL REUS W/ TWL XL LVL3 (GOWN DISPOSABLE) ×2 IMPLANT
GOWN STRL REUS W/TWL LRG LVL3 (GOWN DISPOSABLE) ×6
GOWN STRL REUS W/TWL XL LVL3 (GOWN DISPOSABLE) ×3
HANDPIECE INTERPULSE COAX TIP (DISPOSABLE)
IV NS IRRIG 3000ML ARTHROMATIC (IV SOLUTION) ×3 IMPLANT
KIT BASIN OR (CUSTOM PROCEDURE TRAY) ×3 IMPLANT
KIT TURNOVER KIT B (KITS) ×3 IMPLANT
NS IRRIG 1000ML POUR BTL (IV SOLUTION) ×3 IMPLANT
PACK CV ACCESS (CUSTOM PROCEDURE TRAY) IMPLANT
PACK GENERAL/GYN (CUSTOM PROCEDURE TRAY) IMPLANT
PACK UNIVERSAL I (CUSTOM PROCEDURE TRAY) ×3 IMPLANT
PAD ARMBOARD 7.5X6 YLW CONV (MISCELLANEOUS) ×6 IMPLANT
PULSAVAC PLUS IRRIG FAN TIP (DISPOSABLE)
SET HNDPC FAN SPRY TIP SCT (DISPOSABLE) IMPLANT
SPONGE T-LAP 18X18 ~~LOC~~+RFID (SPONGE) ×3 IMPLANT
STAPLER VISISTAT 35W (STAPLE) ×12 IMPLANT
SUT ETHILON 3 0 PS 1 (SUTURE) ×3 IMPLANT
SUT MNCRL AB 4-0 PS2 18 (SUTURE) IMPLANT
SUT PROLENE 6 0 BV (SUTURE) ×3 IMPLANT
SUT VIC AB 2-0 CT1 27 (SUTURE) ×6
SUT VIC AB 2-0 CT1 TAPERPNT 27 (SUTURE) ×4 IMPLANT
SUT VIC AB 2-0 CTX 36 (SUTURE) IMPLANT
SUT VIC AB 3-0 SH 27 (SUTURE) ×9
SUT VIC AB 3-0 SH 27X BRD (SUTURE) ×6 IMPLANT
SYR 20ML LL LF (SYRINGE) ×3 IMPLANT
TAPE CLOTH SURG 6X10 WHT LF (GAUZE/BANDAGES/DRESSINGS) ×3 IMPLANT
TIP FAN IRRIG PULSAVAC PLUS (DISPOSABLE) IMPLANT
TOWEL GREEN STERILE (TOWEL DISPOSABLE) ×3 IMPLANT
WATER STERILE IRR 1000ML POUR (IV SOLUTION) ×3 IMPLANT

## 2021-02-28 NOTE — Transfer of Care (Signed)
Immediate Anesthesia Transfer of Care Note  Patient: Christopher Shepard  Procedure(s) Performed: EXPLORATION right Brachial, Radial and Ulnar Arteries. (Right: Arm Lower) CARPAL TUNNEL RELEASE (Right: Arm Lower) Forearm FASCIECTOMY, exploration and wound repair. (Right: Arm Lower)  Patient Location: PACU  Anesthesia Type:General  Level of Consciousness: oriented, drowsy and patient cooperative  Airway & Oxygen Therapy: Patient Spontanous Breathing  Post-op Assessment: Report given to RN and Post -op Vital signs reviewed and stable  Post vital signs: Reviewed and stable  Last Vitals:  Vitals Value Taken Time  BP 147/83 02/28/21 0850  Temp    Pulse 116 02/28/21 0852  Resp 13 02/28/21 0852  SpO2 95 % 02/28/21 0852  Vitals shown include unvalidated device data.  Last Pain:  Vitals:   02/28/21 0220  TempSrc:   PainSc: 10-Worst pain ever         Complications: No notable events documented.

## 2021-02-28 NOTE — ED Notes (Signed)
Kerlex dressing placed to the head.
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
## 2021-02-28 NOTE — ED Notes (Signed)
Hand surgeon at bedside. 

## 2021-02-28 NOTE — Anesthesia Procedure Notes (Signed)
Procedure Name: Intubation Date/Time: 02/28/2021 5:26 AM Performed by: Molli Hazard, CRNA Pre-anesthesia Checklist: Patient identified, Emergency Drugs available, Suction available and Patient being monitored Patient Re-evaluated:Patient Re-evaluated prior to induction Oxygen Delivery Method: Circle system utilized Preoxygenation: Pre-oxygenation with 100% oxygen Induction Type: IV induction, Rapid sequence and Cricoid Pressure applied Laryngoscope Size: Miller and 2 Grade View: Grade II Tube type: Oral Tube size: 7.5 mm Number of attempts: 2 Airway Equipment and Method: Stylet Placement Confirmation: ETT inserted through vocal cords under direct vision, positive ETCO2 and breath sounds checked- equal and bilateral Secured at: 23 cm Tube secured with: Tape Dental Injury: Teeth and Oropharynx as per pre-operative assessment
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
## 2021-02-28 NOTE — ED Notes (Addendum)
Pt on 3LPM of oxygen. Trauma provider and primary Provider notified. Oxygen saturation was 85-88% on 2L and is now currently 96% on 3LPM. Family at bedside.  Vascular surgery at bedside.

## 2021-02-28 NOTE — Anesthesia Preprocedure Evaluation (Addendum)
Anesthesia Evaluation  Patient identified by MRN, date of birth, ID band  Reviewed: Allergy & Precautions, NPO status , Patient's Chart, lab work & pertinent test results  Airway Mallampati: II   Neck ROM: Full    Dental  (+) Dental Advisory Given,    Pulmonary Current Smoker,    Pulmonary exam normal        Cardiovascular Normal cardiovascular exam     Neuro/Psych    GI/Hepatic   Endo/Other    Renal/GU      Musculoskeletal   Abdominal   Peds  Hematology   Anesthesia Other Findings   Reproductive/Obstetrics                            Anesthesia Physical Anesthesia Plan  ASA: 2 and emergent  Anesthesia Plan: General   Post-op Pain Management:    Induction: Intravenous, Cricoid pressure planned and Rapid sequence  PONV Risk Score and Plan: 2 and Ondansetron and Dexamethasone  Airway Management Planned: Oral ETT  Additional Equipment: None  Intra-op Plan:   Post-operative Plan: Extubation in OR  Informed Consent: I have reviewed the patients History and Physical, chart, labs and discussed the procedure including the risks, benefits and alternatives for the proposed anesthesia with the patient or authorized representative who has indicated his/her understanding and acceptance.     Dental advisory given  Plan Discussed with: Anesthesiologist, CRNA and Surgeon  Anesthesia Plan Comments:        Anesthesia Quick Evaluation
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
## 2021-02-28 NOTE — Anesthesia Postprocedure Evaluation (Signed)
Anesthesia Post Note  Patient: Christopher Shepard  Procedure(s) Performed: EXPLORATION right Brachial, Radial and Ulnar Arteries. (Right: Arm Lower) CARPAL TUNNEL RELEASE (Right: Arm Lower) Forearm FASCIECTOMY, exploration and wound repair. (Right: Arm Lower)     Patient location during evaluation: PACU Anesthesia Type: General Level of consciousness: awake and alert Pain management: pain level controlled Vital Signs Assessment: post-procedure vital signs reviewed and stable Respiratory status: spontaneous breathing, nonlabored ventilation, respiratory function stable and patient connected to nasal cannula oxygen Cardiovascular status: blood pressure returned to baseline and stable Postop Assessment: no apparent nausea or vomiting Anesthetic complications: no   No notable events documented.  Last Vitals:  Vitals:   02/28/21 1200 02/28/21 1300  BP:  (!) 148/93  Pulse: 83 76  Resp: 11 12  Temp:    SpO2: 98% 97%    Last Pain:  Vitals:   02/28/21 1108  TempSrc: Oral  PainSc: 6                  Christopher Shepard
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
## 2021-02-28 NOTE — ED Notes (Signed)
EDP at bedside for laceration repair  
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
## 2021-03-01 ENCOUNTER — Encounter (HOSPITAL_COMMUNITY): Payer: Self-pay | Admitting: Vascular Surgery

## 2021-03-01 DIAGNOSIS — Z9889 Other specified postprocedural states: Secondary | ICD-10-CM

## 2021-03-01 LAB — LIPID PANEL
Cholesterol: 68 mg/dL (ref 0–200)
HDL: 41 mg/dL (ref >40)
LDL Cholesterol: 23 mg/dL (ref 0–99)
Total CHOL/HDL Ratio: 1.7 RATIO
Triglycerides: 18 mg/dL (ref <150)
VLDL: 4 mg/dL (ref 0–40)

## 2021-03-01 LAB — BASIC METABOLIC PANEL
Anion gap: 7 (ref 5–15)
BUN: 6 mg/dL (ref 6–20)
CO2: 23 mmol/L (ref 22–32)
Calcium: 8 mg/dL — ABNORMAL LOW (ref 8.9–10.3)
Chloride: 103 mmol/L (ref 98–111)
Creatinine, Ser: 1 mg/dL (ref 0.61–1.24)
GFR, Estimated: >60 mL/min (ref >60)
Glucose, Bld: 122 mg/dL — ABNORMAL HIGH (ref 70–99)
Potassium: 3.6 mmol/L (ref 3.5–5.1)
Sodium: 133 mmol/L — ABNORMAL LOW (ref 135–145)

## 2021-03-01 LAB — POCT I-STAT, CHEM 8
BUN: 11 mg/dL (ref 6–20)
Calcium, Ion: 0.98 mmol/L — ABNORMAL LOW (ref 1.15–1.40)
Chloride: 108 mmol/L (ref 98–111)
Creatinine, Ser: 1 mg/dL (ref 0.61–1.24)
Glucose, Bld: 199 mg/dL — ABNORMAL HIGH (ref 70–99)
HCT: 39 % (ref 39.0–52.0)
Hemoglobin: 13.3 g/dL (ref 13.0–17.0)
Potassium: 2.9 mmol/L — ABNORMAL LOW (ref 3.5–5.1)
Sodium: 138 mmol/L (ref 135–145)
TCO2: 19 mmol/L — ABNORMAL LOW (ref 22–32)

## 2021-03-01 LAB — CBC
HCT: 26.5 % — ABNORMAL LOW (ref 39.0–52.0)
Hemoglobin: 9 g/dL — ABNORMAL LOW (ref 13.0–17.0)
MCH: 30.5 pg (ref 26.0–34.0)
MCHC: 34 g/dL (ref 30.0–36.0)
MCV: 89.8 fL (ref 80.0–100.0)
Platelets: 144 10*3/uL — ABNORMAL LOW (ref 150–400)
RBC: 2.95 MIL/uL — ABNORMAL LOW (ref 4.22–5.81)
RDW: 12.7 % (ref 11.5–15.5)
WBC: 10.2 10*3/uL (ref 4.0–10.5)
nRBC: 0 % (ref 0.0–0.2)
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
## 2021-03-01 NOTE — Progress Notes (Signed)
Orthopedic Tech Progress Note Patient Details:  Christopher Shepard 1995/12/20 [id number]  Ortho Devices Type of Ortho Device: Sling immobilizer Ortho Device/Splint Location: lue Ortho Device/Splint Interventions: Ordered  Dropped off shoulder immobilizer.     Aarthi Uyeno L Joylene Wescott 03/01/2021, 4:05 PM

## 2021-03-02 ENCOUNTER — Other Ambulatory Visit (HOSPITAL_COMMUNITY): Payer: Self-pay

## 2021-03-02 DIAGNOSIS — S27321A Contusion of lung, unilateral, initial encounter: Secondary | ICD-10-CM | POA: Diagnosis not present

## 2021-03-02 DIAGNOSIS — S42101A Fracture of unspecified part of scapula, right shoulder, initial encounter for closed fracture: Secondary | ICD-10-CM | POA: Diagnosis not present

## 2021-03-02 DIAGNOSIS — S71111A Laceration without foreign body, right thigh, initial encounter: Secondary | ICD-10-CM | POA: Diagnosis not present

## 2021-03-02 DIAGNOSIS — S0101XA Laceration without foreign body of scalp, initial encounter: Secondary | ICD-10-CM | POA: Diagnosis not present

## 2021-03-02 LAB — BASIC METABOLIC PANEL
Anion gap: 8 (ref 5–15)
BUN: 7 mg/dL (ref 6–20)
CO2: 23 mmol/L (ref 22–32)
Calcium: 8.3 mg/dL — ABNORMAL LOW (ref 8.9–10.3)
Chloride: 103 mmol/L (ref 98–111)
Creatinine, Ser: 0.97 mg/dL (ref 0.61–1.24)
GFR, Estimated: >60 mL/min (ref >60)
Glucose, Bld: 99 mg/dL (ref 70–99)
Potassium: 4.2 mmol/L (ref 3.5–5.1)
Sodium: 134 mmol/L — ABNORMAL LOW (ref 135–145)

## 2021-03-02 LAB — CBC
HCT: 25.2 % — ABNORMAL LOW (ref 39.0–52.0)
Hemoglobin: 8.3 g/dL — ABNORMAL LOW (ref 13.0–17.0)
MCH: 31.3 pg (ref 26.0–34.0)
MCHC: 32.9 g/dL (ref 30.0–36.0)
MCV: 95.1 fL (ref 80.0–100.0)
Platelets: 143 10*3/uL — ABNORMAL LOW (ref 150–400)
RBC: 2.65 MIL/uL — ABNORMAL LOW (ref 4.22–5.81)
RDW: 13 % (ref 11.5–15.5)
WBC: 8.6 10*3/uL (ref 4.0–10.5)
nRBC: 0 % (ref 0.0–0.2)

## 2021-03-02 MED ORDER — ASPIRIN 81 MG PO TBEC
81.0000 mg | DELAYED_RELEASE_TABLET | Freq: Every day | ORAL | 1 refills | Status: AC
  Filled 2021-03-02: qty 30, 30d supply, fill #0

## 2021-03-02 MED ORDER — ASPIRIN EC 81 MG PO TBEC: 81.0000 mg | DELAYED_RELEASE_TABLET | Freq: Every day | ORAL | Status: DC

## 2021-03-02 MED ORDER — OXYCODONE HCL 5 MG PO TABS
5.0000 mg | ORAL_TABLET | Freq: Four times a day (QID) | ORAL | 0 refills | Status: AC | PRN
  Filled 2021-03-02: qty 20, 5d supply, fill #0

## 2021-03-02 MED ORDER — KETOROLAC TROMETHAMINE 15 MG/ML IJ SOLN
30.0000 mg | Freq: Four times a day (QID) | INTRAMUSCULAR | Status: DC
  Administered 2021-03-02: 30 mg via INTRAVENOUS
  Filled 2021-03-02: qty 2

## 2021-03-02 MED ORDER — DOCUSATE SODIUM 100 MG PO CAPS
100.0000 mg | ORAL_CAPSULE | Freq: Two times a day (BID) | ORAL | 0 refills | Status: AC
  Filled 2021-03-02: qty 10, 5d supply, fill #0

## 2021-03-02 MED ORDER — ACETAMINOPHEN 500 MG PO TABS: 1000.0000 mg | ORAL_TABLET | Freq: Four times a day (QID) | ORAL | 0 refills | Status: AC

## 2021-03-02 MED ORDER — MORPHINE SULFATE (PF) 2 MG/ML IV SOLN: 2.0000 mg | INTRAVENOUS | Status: DC | PRN

## 2021-03-02 MED ORDER — METHOCARBAMOL 500 MG PO TABS
1000.0000 mg | ORAL_TABLET | Freq: Three times a day (TID) | ORAL | 0 refills | Status: AC | PRN
  Filled 2021-03-02: qty 60, 10d supply, fill #0

## 2021-03-02 NOTE — Progress Notes (Signed)
VASCULAR SURGERY:  I read Dr. Arita Miss note from yesterday.  It is okay to use a sling for the right arm from a vascular standpoint.  Cari Caraway, MD 7:12 AM
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
## 2021-03-03 ENCOUNTER — Telehealth: Payer: Self-pay

## 2021-03-03 NOTE — Telephone Encounter (Signed)
Transition Care Management Unsuccessful Follow-up Telephone Call  Date of discharge and from where:  03/02/2021-[place]   Attempts:  1st Attempt  Reason for unsuccessful TCM follow-up call:  Left voice message    

## 2021-03-04 NOTE — Telephone Encounter (Signed)
Transition Care Management Unsuccessful Follow-up Telephone Call  Date of discharge and from where:  03/02/2021-[place]  Attempts:  2nd Attempt  Reason for unsuccessful TCM follow-up call:  Left voice message

## 2021-03-05 ENCOUNTER — Emergency Department (HOSPITAL_COMMUNITY)
Admission: EM | Admit: 2021-03-05 | Discharge: 2021-03-06 | Disposition: A | Discharge status: Home / Self Care | Payer: Medicaid Other | Attending: Emergency Medicine | Admitting: Emergency Medicine

## 2021-03-05 ENCOUNTER — Emergency Department (HOSPITAL_COMMUNITY): Payer: Medicaid Other

## 2021-03-05 ENCOUNTER — Other Ambulatory Visit: Payer: Self-pay

## 2021-03-05 ENCOUNTER — Encounter (HOSPITAL_COMMUNITY): Payer: Self-pay | Admitting: Emergency Medicine

## 2021-03-05 DIAGNOSIS — R110 Nausea: Secondary | ICD-10-CM | POA: Insufficient documentation

## 2021-03-05 DIAGNOSIS — Y848 Other medical procedures as the cause of abnormal reaction of the patient, or of later complication, without mention of misadventure at the time of the procedure: Secondary | ICD-10-CM | POA: Insufficient documentation

## 2021-03-05 DIAGNOSIS — T8131XA Disruption of external operation (surgical) wound, not elsewhere classified, initial encounter: Secondary | ICD-10-CM | POA: Diagnosis not present

## 2021-03-05 DIAGNOSIS — Z4801 Encounter for change or removal of surgical wound dressing: Secondary | ICD-10-CM | POA: Diagnosis not present

## 2021-03-05 DIAGNOSIS — Z5321 Procedure and treatment not carried out due to patient leaving prior to being seen by health care provider: Secondary | ICD-10-CM | POA: Insufficient documentation

## 2021-03-05 DIAGNOSIS — R059 Cough, unspecified: Secondary | ICD-10-CM | POA: Insufficient documentation

## 2021-03-05 DIAGNOSIS — R9431 Abnormal electrocardiogram [ECG] [EKG]: Secondary | ICD-10-CM | POA: Diagnosis not present

## 2021-03-05 LAB — COMPREHENSIVE METABOLIC PANEL
ALT: 26 U/L (ref 0–44)
AST: 43 U/L — ABNORMAL HIGH (ref 15–41)
Albumin: 3.6 g/dL (ref 3.5–5.0)
Alkaline Phosphatase: 61 U/L (ref 38–126)
Anion gap: 9 (ref 5–15)
BUN: 12 mg/dL (ref 6–20)
CO2: 24 mmol/L (ref 22–32)
Calcium: 9.1 mg/dL (ref 8.9–10.3)
Chloride: 99 mmol/L (ref 98–111)
Creatinine, Ser: 0.9 mg/dL (ref 0.61–1.24)
GFR, Estimated: >60 mL/min (ref >60)
Glucose, Bld: 108 mg/dL — ABNORMAL HIGH (ref 70–99)
Potassium: 3.8 mmol/L (ref 3.5–5.1)
Sodium: 132 mmol/L — ABNORMAL LOW (ref 135–145)
Total Bilirubin: 0.9 mg/dL (ref 0.3–1.2)
Total Protein: 7.2 g/dL (ref 6.5–8.1)

## 2021-03-05 LAB — CBC WITH DIFFERENTIAL/PLATELET
Abs Immature Granulocytes: 0.07 10*3/uL (ref 0.00–0.07)
Basophils Absolute: 0.1 10*3/uL (ref 0.0–0.1)
Basophils Relative: 0 %
Eosinophils Absolute: 0.4 10*3/uL (ref 0.0–0.5)
Eosinophils Relative: 4 %
HCT: 26.8 % — ABNORMAL LOW (ref 39.0–52.0)
Hemoglobin: 9.2 g/dL — ABNORMAL LOW (ref 13.0–17.0)
Immature Granulocytes: 1 %
Lymphocytes Relative: 21 %
Lymphs Abs: 2.4 10*3/uL (ref 0.7–4.0)
MCH: 31.5 pg (ref 26.0–34.0)
MCHC: 34.3 g/dL (ref 30.0–36.0)
MCV: 91.8 fL (ref 80.0–100.0)
Monocytes Absolute: 1.2 10*3/uL — ABNORMAL HIGH (ref 0.1–1.0)
Monocytes Relative: 10 %
Neutro Abs: 7.4 10*3/uL (ref 1.7–7.7)
Neutrophils Relative %: 64 %
Platelets: 447 10*3/uL — ABNORMAL HIGH (ref 150–400)
RBC: 2.92 MIL/uL — ABNORMAL LOW (ref 4.22–5.81)
RDW: 12.5 % (ref 11.5–15.5)
WBC: 11.6 10*3/uL — ABNORMAL HIGH (ref 4.0–10.5)
nRBC: 0 % (ref 0.0–0.2)

## 2021-03-05 LAB — LIPASE, BLOOD: Lipase: 22 U/L (ref 11–51)

## 2021-03-05 MED ORDER — OXYCODONE-ACETAMINOPHEN 5-325 MG PO TABS
1.0000 | ORAL_TABLET | Freq: Once | ORAL | Status: AC
  Administered 2021-03-05: 1 via ORAL
  Filled 2021-03-05: qty 1

## 2021-03-05 NOTE — ED Notes (Signed)
Called pt's called in waiting room 3x's, no answer.
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
## 2021-03-05 NOTE — Telephone Encounter (Signed)
Transition Care Management Unsuccessful Follow-up Telephone Call  Date of discharge and from where:  03/02/2021-[place]   Attempts:  3rd Attempt  Reason for unsuccessful TCM follow-up call:  Left voice message

## 2021-03-06 NOTE — ED Notes (Signed)
Pt seen leaving the lobby and called for vitals and did not respond

## 2021-03-08 ENCOUNTER — Telehealth: Payer: Self-pay
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
## 2021-03-10 DIAGNOSIS — Z419 Encounter for procedure for purposes other than remedying health state, unspecified: Secondary | ICD-10-CM | POA: Diagnosis not present

## 2021-03-17 ENCOUNTER — Other Ambulatory Visit: Payer: Self-pay

## 2021-03-17 DIAGNOSIS — Z7689 Persons encountering health services in other specified circumstances: Secondary | ICD-10-CM | POA: Diagnosis not present

## 2021-04-01 DIAGNOSIS — Z7689 Persons encountering health services in other specified circumstances: Secondary | ICD-10-CM | POA: Diagnosis not present

## 2021-04-01 DIAGNOSIS — M25641 Stiffness of right hand, not elsewhere classified: Secondary | ICD-10-CM | POA: Diagnosis not present

## 2021-04-01 DIAGNOSIS — M25611 Stiffness of right shoulder, not elsewhere classified: Secondary | ICD-10-CM | POA: Diagnosis not present

## 2021-04-01 DIAGNOSIS — R52 Pain, unspecified: Secondary | ICD-10-CM | POA: Diagnosis not present

## 2021-04-01 DIAGNOSIS — W3400XA Accidental discharge from unspecified firearms or gun, initial encounter: Secondary | ICD-10-CM | POA: Diagnosis not present

## 2021-04-01 DIAGNOSIS — M25621 Stiffness of right elbow, not elsewhere classified: Secondary | ICD-10-CM | POA: Diagnosis not present

## 2021-04-01 DIAGNOSIS — M25631 Stiffness of right wrist, not elsewhere classified: Secondary | ICD-10-CM | POA: Diagnosis not present

## 2021-04-09 ENCOUNTER — Encounter: Payer: Medicaid Other | Admitting: Vascular Surgery

## 2021-04-09 ENCOUNTER — Other Ambulatory Visit (HOSPITAL_COMMUNITY): Payer: Medicaid Other

## 2021-04-09 DIAGNOSIS — Z419 Encounter for procedure for purposes other than remedying health state, unspecified: Secondary | ICD-10-CM | POA: Diagnosis not present

## 2021-04-23 DIAGNOSIS — M25631 Stiffness of right wrist, not elsewhere classified: Secondary | ICD-10-CM | POA: Diagnosis not present

## 2021-04-23 DIAGNOSIS — M25611 Stiffness of right shoulder, not elsewhere classified: Secondary | ICD-10-CM | POA: Diagnosis not present

## 2021-04-23 DIAGNOSIS — Z7689 Persons encountering health services in other specified circumstances: Secondary | ICD-10-CM | POA: Diagnosis not present

## 2021-04-23 DIAGNOSIS — W3400XA Accidental discharge from unspecified firearms or gun, initial encounter: Secondary | ICD-10-CM | POA: Diagnosis not present

## 2021-04-23 DIAGNOSIS — R52 Pain, unspecified: Secondary | ICD-10-CM | POA: Diagnosis not present

## 2021-04-23 DIAGNOSIS — M25621 Stiffness of right elbow, not elsewhere classified: Secondary | ICD-10-CM | POA: Diagnosis not present

## 2021-04-23 DIAGNOSIS — M25641 Stiffness of right hand, not elsewhere classified: Secondary | ICD-10-CM | POA: Diagnosis not present

## 2021-04-28 DIAGNOSIS — Z7689 Persons encountering health services in other specified circumstances: Secondary | ICD-10-CM | POA: Diagnosis not present

## 2021-05-10 DIAGNOSIS — Z419 Encounter for procedure for purposes other than remedying health state, unspecified: Secondary | ICD-10-CM | POA: Diagnosis not present

## 2021-05-27 DIAGNOSIS — Z7689 Persons encountering health services in other specified circumstances: Secondary | ICD-10-CM | POA: Diagnosis not present

## 2021-06-10 DIAGNOSIS — Z419 Encounter for procedure for purposes other than remedying health state, unspecified: Secondary | ICD-10-CM | POA: Diagnosis not present

## 2021-07-08 DIAGNOSIS — Z419 Encounter for procedure for purposes other than remedying health state, unspecified: Secondary | ICD-10-CM | POA: Diagnosis not present

## 2021-07-28 DIAGNOSIS — S41131D Puncture wound without foreign body of right upper arm, subsequent encounter: Secondary | ICD-10-CM | POA: Diagnosis not present

## 2021-08-08 DIAGNOSIS — Z419 Encounter for procedure for purposes other than remedying health state, unspecified: Secondary | ICD-10-CM | POA: Diagnosis not present

## 2021-09-02 DIAGNOSIS — Z7689 Persons encountering health services in other specified circumstances: Secondary | ICD-10-CM | POA: Diagnosis not present

## 2021-09-07 DIAGNOSIS — Z419 Encounter for procedure for purposes other than remedying health state, unspecified: Secondary | ICD-10-CM | POA: Diagnosis not present

## 2021-10-08 DIAGNOSIS — Z419 Encounter for procedure for purposes other than remedying health state, unspecified: Secondary | ICD-10-CM | POA: Diagnosis not present

## 2021-11-07 DIAGNOSIS — Z419 Encounter for procedure for purposes other than remedying health state, unspecified: Secondary | ICD-10-CM | POA: Diagnosis not present

## 2021-12-08 DIAGNOSIS — Z419 Encounter for procedure for purposes other than remedying health state, unspecified: Secondary | ICD-10-CM | POA: Diagnosis not present

## 2022-01-08 DIAGNOSIS — Z419 Encounter for procedure for purposes other than remedying health state, unspecified: Secondary | ICD-10-CM | POA: Diagnosis not present

## 2022-02-07 DIAGNOSIS — Z419 Encounter for procedure for purposes other than remedying health state, unspecified: Secondary | ICD-10-CM | POA: Diagnosis not present

## 2022-03-10 DIAGNOSIS — Z419 Encounter for procedure for purposes other than remedying health state, unspecified: Secondary | ICD-10-CM | POA: Diagnosis not present

## 2022-04-09 DIAGNOSIS — Z419 Encounter for procedure for purposes other than remedying health state, unspecified: Secondary | ICD-10-CM | POA: Diagnosis not present

## 2022-05-10 DIAGNOSIS — Z419 Encounter for procedure for purposes other than remedying health state, unspecified: Secondary | ICD-10-CM | POA: Diagnosis not present

## 2022-06-10 DIAGNOSIS — Z419 Encounter for procedure for purposes other than remedying health state, unspecified: Secondary | ICD-10-CM | POA: Diagnosis not present

## 2022-07-09 DIAGNOSIS — Z419 Encounter for procedure for purposes other than remedying health state, unspecified: Secondary | ICD-10-CM | POA: Diagnosis not present

## 2022-08-09 DIAGNOSIS — Z419 Encounter for procedure for purposes other than remedying health state, unspecified: Secondary | ICD-10-CM | POA: Diagnosis not present

## 2022-09-08 DIAGNOSIS — Z419 Encounter for procedure for purposes other than remedying health state, unspecified: Secondary | ICD-10-CM | POA: Diagnosis not present

## 2022-10-09 DIAGNOSIS — Z419 Encounter for procedure for purposes other than remedying health state, unspecified: Secondary | ICD-10-CM | POA: Diagnosis not present

## 2022-11-08 DIAGNOSIS — Z419 Encounter for procedure for purposes other than remedying health state, unspecified: Secondary | ICD-10-CM | POA: Diagnosis not present

## 2022-12-09 DIAGNOSIS — Z419 Encounter for procedure for purposes other than remedying health state, unspecified: Secondary | ICD-10-CM | POA: Diagnosis not present

## 2023-01-09 DIAGNOSIS — Z419 Encounter for procedure for purposes other than remedying health state, unspecified: Secondary | ICD-10-CM | POA: Diagnosis not present

## 2023-02-08 DIAGNOSIS — Z419 Encounter for procedure for purposes other than remedying health state, unspecified: Secondary | ICD-10-CM | POA: Diagnosis not present
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
## 2023-03-11 DIAGNOSIS — Z419 Encounter for procedure for purposes other than remedying health state, unspecified: Secondary | ICD-10-CM | POA: Diagnosis not present

## 2023-04-10 DIAGNOSIS — Z419 Encounter for procedure for purposes other than remedying health state, unspecified: Secondary | ICD-10-CM | POA: Diagnosis not present

## 2023-05-11 DIAGNOSIS — Z419 Encounter for procedure for purposes other than remedying health state, unspecified: Secondary | ICD-10-CM | POA: Diagnosis not present

## 2023-06-11 DIAGNOSIS — Z419 Encounter for procedure for purposes other than remedying health state, unspecified: Secondary | ICD-10-CM | POA: Diagnosis not present

## 2023-07-09 DIAGNOSIS — Z419 Encounter for procedure for purposes other than remedying health state, unspecified: Secondary | ICD-10-CM | POA: Diagnosis not present

## 2023-08-20 DIAGNOSIS — Z419 Encounter for procedure for purposes other than remedying health state, unspecified: Secondary | ICD-10-CM | POA: Diagnosis not present

## 2023-09-19 DIAGNOSIS — Z419 Encounter for procedure for purposes other than remedying health state, unspecified: Secondary | ICD-10-CM | POA: Diagnosis not present

## 2023-10-20 DIAGNOSIS — Z419 Encounter for procedure for purposes other than remedying health state, unspecified: Secondary | ICD-10-CM | POA: Diagnosis not present

## 2023-11-19 DIAGNOSIS — Z419 Encounter for procedure for purposes other than remedying health state, unspecified: Secondary | ICD-10-CM | POA: Diagnosis not present

## 2023-12-20 DIAGNOSIS — Z419 Encounter for procedure for purposes other than remedying health state, unspecified: Secondary | ICD-10-CM | POA: Diagnosis not present

## 2024-01-20 DIAGNOSIS — Z419 Encounter for procedure for purposes other than remedying health state, unspecified: Secondary | ICD-10-CM | POA: Diagnosis not present

## 2024-04-20 DIAGNOSIS — Z419 Encounter for procedure for purposes other than remedying health state, unspecified: Secondary | ICD-10-CM | POA: Diagnosis not present
